# Patient Record
Sex: Male | Born: 1950 | Race: White | Hispanic: No | Marital: Married | Smoking: Former smoker
Health system: Southern US, Community
[De-identification: ages and names within clinical notes are randomized; demographics above are authoritative.]

## PROBLEM LIST (undated history)

## (undated) DIAGNOSIS — N183 Chronic kidney disease, stage 3 unspecified: Secondary | ICD-10-CM

## (undated) DIAGNOSIS — M109 Gout, unspecified: Secondary | ICD-10-CM

## (undated) DIAGNOSIS — E785 Hyperlipidemia, unspecified: Secondary | ICD-10-CM

---

## 2003-01-17 ENCOUNTER — Encounter: Payer: Self-pay | Admitting: Internal Medicine

## 2003-01-17 ENCOUNTER — Encounter: Admission: RE | Admit: 2003-01-17 | Discharge: 2003-01-17 | Payer: Self-pay | Admitting: Internal Medicine

## 2004-09-24 ENCOUNTER — Ambulatory Visit: Payer: Self-pay | Admitting: Internal Medicine

## 2004-10-01 ENCOUNTER — Ambulatory Visit: Payer: Self-pay | Admitting: Internal Medicine

## 2004-10-16 ENCOUNTER — Ambulatory Visit: Payer: Self-pay | Admitting: Internal Medicine

## 2004-12-11 ENCOUNTER — Ambulatory Visit: Payer: Self-pay | Admitting: Internal Medicine

## 2005-03-16 ENCOUNTER — Ambulatory Visit: Payer: Self-pay | Admitting: Internal Medicine

## 2005-10-17 ENCOUNTER — Emergency Department (HOSPITAL_COMMUNITY): Admission: EM | Admit: 2005-10-17 | Discharge: 2005-10-17 | Payer: Self-pay | Admitting: Emergency Medicine

## 2006-09-23 ENCOUNTER — Ambulatory Visit: Payer: Self-pay | Admitting: Internal Medicine

## 2007-03-14 ENCOUNTER — Ambulatory Visit: Payer: Self-pay | Admitting: Internal Medicine

## 2007-03-20 ENCOUNTER — Ambulatory Visit: Payer: Self-pay | Admitting: Internal Medicine

## 2007-03-29 DIAGNOSIS — E785 Hyperlipidemia, unspecified: Secondary | ICD-10-CM | POA: Insufficient documentation

## 2007-04-14 ENCOUNTER — Telehealth: Payer: Self-pay | Admitting: Internal Medicine

## 2007-10-27 ENCOUNTER — Ambulatory Visit: Payer: Self-pay | Admitting: Family Medicine

## 2007-10-27 DIAGNOSIS — F411 Generalized anxiety disorder: Secondary | ICD-10-CM | POA: Insufficient documentation

## 2007-11-03 ENCOUNTER — Encounter: Payer: Self-pay | Admitting: Family Medicine

## 2007-11-10 ENCOUNTER — Telehealth: Payer: Self-pay | Admitting: Family Medicine

## 2020-09-20 DIAGNOSIS — M25531 Pain in right wrist: Secondary | ICD-10-CM | POA: Diagnosis not present

## 2020-10-09 DIAGNOSIS — M79672 Pain in left foot: Secondary | ICD-10-CM | POA: Diagnosis not present

## 2020-10-09 DIAGNOSIS — M79675 Pain in left toe(s): Secondary | ICD-10-CM | POA: Diagnosis not present

## 2020-12-26 DIAGNOSIS — N1831 Chronic kidney disease, stage 3a: Secondary | ICD-10-CM | POA: Diagnosis not present

## 2020-12-26 DIAGNOSIS — E782 Mixed hyperlipidemia: Secondary | ICD-10-CM | POA: Diagnosis not present

## 2020-12-26 DIAGNOSIS — Z1211 Encounter for screening for malignant neoplasm of colon: Secondary | ICD-10-CM | POA: Diagnosis not present

## 2020-12-26 DIAGNOSIS — R7309 Other abnormal glucose: Secondary | ICD-10-CM | POA: Diagnosis not present

## 2020-12-26 DIAGNOSIS — Z0000 Encounter for general adult medical examination without abnormal findings: Secondary | ICD-10-CM | POA: Diagnosis not present

## 2020-12-26 DIAGNOSIS — R5383 Other fatigue: Secondary | ICD-10-CM | POA: Diagnosis not present

## 2021-04-17 DIAGNOSIS — M7989 Other specified soft tissue disorders: Secondary | ICD-10-CM | POA: Diagnosis not present

## 2021-04-24 DIAGNOSIS — M109 Gout, unspecified: Secondary | ICD-10-CM | POA: Diagnosis not present

## 2021-04-24 DIAGNOSIS — M7989 Other specified soft tissue disorders: Secondary | ICD-10-CM | POA: Diagnosis not present

## 2021-04-24 DIAGNOSIS — N1831 Chronic kidney disease, stage 3a: Secondary | ICD-10-CM | POA: Diagnosis not present

## 2021-05-14 DIAGNOSIS — M109 Gout, unspecified: Secondary | ICD-10-CM | POA: Diagnosis not present

## 2021-05-27 DIAGNOSIS — M109 Gout, unspecified: Secondary | ICD-10-CM | POA: Diagnosis not present

## 2021-05-28 ENCOUNTER — Ambulatory Visit
Admission: RE | Admit: 2021-05-28 | Discharge: 2021-05-28 | Disposition: A | Discharge status: Home / Self Care | Payer: Self-pay | Source: Ambulatory Visit | Attending: Family Medicine | Admitting: Family Medicine

## 2021-05-28 ENCOUNTER — Other Ambulatory Visit: Payer: Self-pay | Admitting: Family Medicine

## 2021-05-28 DIAGNOSIS — R52 Pain, unspecified: Secondary | ICD-10-CM

## 2021-05-28 DIAGNOSIS — M19071 Primary osteoarthritis, right ankle and foot: Secondary | ICD-10-CM | POA: Diagnosis not present

## 2021-06-08 ENCOUNTER — Encounter (HOSPITAL_COMMUNITY): Payer: Self-pay | Admitting: Family Medicine

## 2021-06-08 ENCOUNTER — Other Ambulatory Visit: Payer: Self-pay

## 2021-06-08 ENCOUNTER — Emergency Department (HOSPITAL_COMMUNITY): Payer: Medicare Other

## 2021-06-08 ENCOUNTER — Inpatient Hospital Stay (HOSPITAL_COMMUNITY)
Admission: EM | Admit: 2021-06-08 | Discharge: 2021-06-15 | DRG: 308 | Disposition: A | Discharge status: Home / Self Care | Payer: Medicare Other | Source: Ambulatory Visit | Attending: Family Medicine | Admitting: Family Medicine

## 2021-06-08 DIAGNOSIS — G479 Sleep disorder, unspecified: Secondary | ICD-10-CM | POA: Diagnosis not present

## 2021-06-08 DIAGNOSIS — R531 Weakness: Secondary | ICD-10-CM | POA: Diagnosis not present

## 2021-06-08 DIAGNOSIS — I5021 Acute systolic (congestive) heart failure: Secondary | ICD-10-CM | POA: Diagnosis present

## 2021-06-08 DIAGNOSIS — I429 Cardiomyopathy, unspecified: Secondary | ICD-10-CM | POA: Diagnosis not present

## 2021-06-08 DIAGNOSIS — F419 Anxiety disorder, unspecified: Secondary | ICD-10-CM | POA: Diagnosis present

## 2021-06-08 DIAGNOSIS — I340 Nonrheumatic mitral (valve) insufficiency: Secondary | ICD-10-CM | POA: Diagnosis not present

## 2021-06-08 DIAGNOSIS — I513 Intracardiac thrombosis, not elsewhere classified: Secondary | ICD-10-CM | POA: Diagnosis not present

## 2021-06-08 DIAGNOSIS — M109 Gout, unspecified: Secondary | ICD-10-CM | POA: Diagnosis present

## 2021-06-08 DIAGNOSIS — R002 Palpitations: Secondary | ICD-10-CM | POA: Diagnosis not present

## 2021-06-08 DIAGNOSIS — I130 Hypertensive heart and chronic kidney disease with heart failure and stage 1 through stage 4 chronic kidney disease, or unspecified chronic kidney disease: Principal | ICD-10-CM | POA: Diagnosis present

## 2021-06-08 DIAGNOSIS — R000 Tachycardia, unspecified: Secondary | ICD-10-CM | POA: Diagnosis not present

## 2021-06-08 DIAGNOSIS — I517 Cardiomegaly: Secondary | ICD-10-CM | POA: Diagnosis not present

## 2021-06-08 DIAGNOSIS — N1832 Chronic kidney disease, stage 3b: Secondary | ICD-10-CM | POA: Diagnosis present

## 2021-06-08 DIAGNOSIS — Z743 Need for continuous supervision: Secondary | ICD-10-CM | POA: Diagnosis not present

## 2021-06-08 DIAGNOSIS — E785 Hyperlipidemia, unspecified: Secondary | ICD-10-CM | POA: Diagnosis not present

## 2021-06-08 DIAGNOSIS — E1122 Type 2 diabetes mellitus with diabetic chronic kidney disease: Secondary | ICD-10-CM | POA: Diagnosis not present

## 2021-06-08 DIAGNOSIS — N179 Acute kidney failure, unspecified: Secondary | ICD-10-CM | POA: Diagnosis not present

## 2021-06-08 DIAGNOSIS — I4891 Unspecified atrial fibrillation: Secondary | ICD-10-CM | POA: Diagnosis present

## 2021-06-08 DIAGNOSIS — Z20822 Contact with and (suspected) exposure to covid-19: Secondary | ICD-10-CM | POA: Diagnosis not present

## 2021-06-08 DIAGNOSIS — I4819 Other persistent atrial fibrillation: Secondary | ICD-10-CM | POA: Diagnosis not present

## 2021-06-08 DIAGNOSIS — Z8616 Personal history of COVID-19: Secondary | ICD-10-CM

## 2021-06-08 DIAGNOSIS — I509 Heart failure, unspecified: Secondary | ICD-10-CM

## 2021-06-08 DIAGNOSIS — R9431 Abnormal electrocardiogram [ECG] [EKG]: Secondary | ICD-10-CM

## 2021-06-08 DIAGNOSIS — R0602 Shortness of breath: Secondary | ICD-10-CM

## 2021-06-08 DIAGNOSIS — E876 Hypokalemia: Secondary | ICD-10-CM | POA: Diagnosis not present

## 2021-06-08 DIAGNOSIS — Z23 Encounter for immunization: Secondary | ICD-10-CM

## 2021-06-08 DIAGNOSIS — R0902 Hypoxemia: Secondary | ICD-10-CM | POA: Diagnosis not present

## 2021-06-08 DIAGNOSIS — I081 Rheumatic disorders of both mitral and tricuspid valves: Secondary | ICD-10-CM | POA: Diagnosis not present

## 2021-06-08 DIAGNOSIS — N183 Chronic kidney disease, stage 3 unspecified: Secondary | ICD-10-CM | POA: Diagnosis not present

## 2021-06-08 DIAGNOSIS — I499 Cardiac arrhythmia, unspecified: Secondary | ICD-10-CM | POA: Diagnosis not present

## 2021-06-08 DIAGNOSIS — R5383 Other fatigue: Secondary | ICD-10-CM | POA: Diagnosis not present

## 2021-06-08 HISTORY — DX: Chronic kidney disease, stage 3 unspecified: N18.30

## 2021-06-08 HISTORY — DX: Hyperlipidemia, unspecified: E78.5

## 2021-06-08 HISTORY — DX: Gout, unspecified: M10.9

## 2021-06-08 LAB — COMPREHENSIVE METABOLIC PANEL
ALT: 18 U/L (ref 0–44)
AST: 21 U/L (ref 15–41)
Albumin: 3.1 g/dL — ABNORMAL LOW (ref 3.5–5.0)
Alkaline Phosphatase: 110 U/L (ref 38–126)
Anion gap: 10 (ref 5–15)
BUN: 29 mg/dL — ABNORMAL HIGH (ref 8–23)
CO2: 21 mmol/L — ABNORMAL LOW (ref 22–32)
Calcium: 8.8 mg/dL — ABNORMAL LOW (ref 8.9–10.3)
Chloride: 109 mmol/L (ref 98–111)
Creatinine, Ser: 1.53 mg/dL — ABNORMAL HIGH (ref 0.61–1.24)
GFR, Estimated: 49 mL/min — ABNORMAL LOW (ref >60)
Glucose, Bld: 107 mg/dL — ABNORMAL HIGH (ref 70–99)
Potassium: 4.4 mmol/L (ref 3.5–5.1)
Sodium: 140 mmol/L (ref 135–145)
Total Bilirubin: 1.1 mg/dL (ref 0.3–1.2)
Total Protein: 7.2 g/dL (ref 6.5–8.1)

## 2021-06-08 LAB — TROPONIN I (HIGH SENSITIVITY)
Troponin I (High Sensitivity): 16 ng/L (ref <18)
Troponin I (High Sensitivity): 15 ng/L (ref <18)

## 2021-06-08 LAB — URINALYSIS, ROUTINE W REFLEX MICROSCOPIC
Bilirubin Urine: NEGATIVE
Glucose, UA: NEGATIVE mg/dL
Hgb urine dipstick: NEGATIVE
Ketones, ur: NEGATIVE mg/dL
Leukocytes,Ua: NEGATIVE
Nitrite: NEGATIVE
Protein, ur: NEGATIVE mg/dL
Specific Gravity, Urine: 1.014 (ref 1.005–1.030)
pH: 5 (ref 5.0–8.0)

## 2021-06-08 LAB — BRAIN NATRIURETIC PEPTIDE: B Natriuretic Peptide: 1105.3 pg/mL — ABNORMAL HIGH (ref 0.0–100.0)

## 2021-06-08 LAB — RESP PANEL BY RT-PCR (FLU A&B, COVID) ARPGX2
Influenza A by PCR: NEGATIVE
Influenza B by PCR: NEGATIVE
SARS Coronavirus 2 by RT PCR: NEGATIVE

## 2021-06-08 LAB — CBC WITH DIFFERENTIAL/PLATELET
Abs Immature Granulocytes: 0.04 10*3/uL (ref 0.00–0.07)
Basophils Absolute: 0.1 10*3/uL (ref 0.0–0.1)
Basophils Relative: 1 %
Eosinophils Absolute: 0.1 10*3/uL (ref 0.0–0.5)
Eosinophils Relative: 2 %
HCT: 39.9 % (ref 39.0–52.0)
Hemoglobin: 12.1 g/dL — ABNORMAL LOW (ref 13.0–17.0)
Immature Granulocytes: 1 %
Lymphocytes Relative: 19 %
Lymphs Abs: 1.4 10*3/uL (ref 0.7–4.0)
MCH: 26.7 pg (ref 26.0–34.0)
MCHC: 30.3 g/dL (ref 30.0–36.0)
MCV: 88.1 fL (ref 80.0–100.0)
Monocytes Absolute: 0.8 10*3/uL (ref 0.1–1.0)
Monocytes Relative: 11 %
Neutro Abs: 5 10*3/uL (ref 1.7–7.7)
Neutrophils Relative %: 66 %
Platelets: 205 10*3/uL (ref 150–400)
RBC: 4.53 MIL/uL (ref 4.22–5.81)
RDW: 15.8 % — ABNORMAL HIGH (ref 11.5–15.5)
WBC: 7.4 10*3/uL (ref 4.0–10.5)
nRBC: 0 % (ref 0.0–0.2)

## 2021-06-08 LAB — MAGNESIUM: Magnesium: 2.1 mg/dL (ref 1.7–2.4)

## 2021-06-08 LAB — TSH: TSH: 3.281 u[IU]/mL (ref 0.350–4.500)

## 2021-06-08 MED ORDER — FUROSEMIDE 10 MG/ML IJ SOLN
40.0000 mg | Freq: Two times a day (BID) | INTRAMUSCULAR | Status: AC
  Administered 2021-06-09 (×2): 40 mg via INTRAVENOUS
  Filled 2021-06-08 (×2): qty 4

## 2021-06-08 MED ORDER — FUROSEMIDE 10 MG/ML IJ SOLN
40.0000 mg | Freq: Once | INTRAMUSCULAR | Status: AC
  Administered 2021-06-09: 40 mg via INTRAVENOUS
  Filled 2021-06-08: qty 4

## 2021-06-08 MED ORDER — ACETAMINOPHEN 650 MG RE SUPP: 650.0000 mg | Freq: Four times a day (QID) | RECTAL | Status: DC | PRN

## 2021-06-08 MED ORDER — SODIUM CHLORIDE 0.9 % IV BOLUS
500.0000 mL | Freq: Once | INTRAVENOUS | Status: AC
  Administered 2021-06-08: 500 mL via INTRAVENOUS

## 2021-06-08 MED ORDER — APIXABAN 5 MG PO TABS
5.0000 mg | ORAL_TABLET | Freq: Two times a day (BID) | ORAL | Status: DC
  Administered 2021-06-09: 5 mg via ORAL
  Filled 2021-06-08: qty 1

## 2021-06-08 MED ORDER — ACETAMINOPHEN 325 MG PO TABS: 650.0000 mg | ORAL_TABLET | Freq: Four times a day (QID) | ORAL | Status: DC | PRN

## 2021-06-08 MED ORDER — SODIUM CHLORIDE 0.9 % IV SOLN: 250.0000 mL | INTRAVENOUS | Status: DC | PRN

## 2021-06-08 MED ORDER — SODIUM CHLORIDE 0.9% FLUSH
3.0000 mL | INTRAVENOUS | Status: DC | PRN
  Administered 2021-06-11: 3 mL via INTRAVENOUS

## 2021-06-08 MED ORDER — DILTIAZEM HCL-DEXTROSE 125-5 MG/125ML-% IV SOLN (PREMIX)
5.0000 mg/h | INTRAVENOUS | Status: DC
  Administered 2021-06-08: 5 mg/h via INTRAVENOUS
  Administered 2021-06-09 (×2): 15 mg/h via INTRAVENOUS
  Filled 2021-06-08 (×3): qty 125

## 2021-06-08 MED ORDER — SODIUM CHLORIDE 0.9% FLUSH
3.0000 mL | Freq: Two times a day (BID) | INTRAVENOUS | Status: DC
  Administered 2021-06-09 – 2021-06-15 (×12): 3 mL via INTRAVENOUS

## 2021-06-08 NOTE — H&P (Signed)
History and Physical    Brian Weeks:[id number] DOB: Sep 09, 1950 DOA: 06/08/2021  PCP: Kristen Loader, FNP   Patient coming from: Home  Chief Complaint: SOB with exertion, decreased energy level  HPI: Brian Weeks is a 70 y.o. male with medical history significant for Gout, HLD who presents with complaint of shortness of breath that is worsened by exertion.  Ports symptoms been present for the last few weeks.  He also reports decreased energy level and occasional dizziness when he first stands up.  He was diagnosed with gout a few weeks ago and has not been as active as he normally is with the gout.  He does notice bilateral lower leg swelling over the last week.  He is also had difficulty sleeping as he was "smothered" when he lays flat.  He has been sleeping at an incline in the last few nights but has not slept well he states.  Reports he feels like his heart was jumping this morning which caused him to go to the doctor and EKG showed he had an irregular rhythm and he was sent to the hospital for further evaluation.  He denies having any chest pain but he does report feeling intermittent palpitations.  Not noticed any factors which initially caused his symptoms.  Now his shortness of breath is worsened with exertion or activity. States he has never had these problems in the past.  States he has not been drinking alcohol recently and he does not use tobacco or illicit drugs.  ED Course: He has been hemodynamically stable in the emergency room with no fever.  He was found to have an elevated BNP of 1105 with initial troponin levels being normal at 16 and 15.  He has mild AKI with creatinine 1.53.  Electrolytes are normal.  CBC was unremarkable.  TSH was normal at 3.281.  COVID-19 and influenza A and B are negative.  ER physician discussed with cardiology and patient was not a candidate for cardioversion with an unknown time of onset of his atrial fibrillation.  EKG showed atrial  fibrillation with RVR.  He was started on Cardizem infusion.  Hospitalist service was asked to admit for further management  Review of Systems:  General: Denies weakness, fever, chills, weight loss, night sweats.  Denies dizziness.  Denies change in appetite HENT: Denies head trauma, headache, denies change in hearing, tinnitus. Denies nasal bleeding. Denies sore throat, sores in mouth.  Denies difficulty swallowing Eyes: Denies blurry vision, pain in eye, drainage.  Denies discoloration of eyes. Neck: Denies pain.  Denies swelling.  Denies pain with movement. Cardiovascular: Denies chest pain, palpitations.  Reports edema. Reports orthopnea Respiratory: Reports shortness of breath. Denies cough.  Denies wheezing.  Denies sputum production Gastrointestinal: Denies abdominal pain, swelling.  Denies nausea, vomiting, diarrhea.  Denies melena.  Denies hematemesis. Musculoskeletal: Denies limitation of movement.  Denies deformity or swelling.  Denies pain.  Denies arthralgias or myalgias. Genitourinary: Denies pelvic pain.  Denies urinary frequency or hesitancy.  Denies dysuria.  Skin: Denies rash.  Denies petechiae, purpura, ecchymosis. Neurological: Denies syncope.  Denies seizure activity.  Denies paresthesia.  Denies slurred speech, drooping face.  Denies visual change. Psychiatric: Denies depression, anxiety. Denies hallucinations.  History reviewed. No pertinent past medical history.  History reviewed. No pertinent surgical history.  Social History  reports that he has never smoked. He has never used smokeless tobacco. He reports that he does not currently use alcohol. He reports that he does not use drugs.  Not on File  History reviewed. No pertinent family history.   Prior to Admission medications   Not on File    Physical Exam: Vitals:   06/08/21 2100 06/08/21 2157 06/08/21 2200 06/08/21 2245  BP: (!) 136/96 (!) 123/94 (!) 123/96 107/87  Pulse: 69 (!) 53 (!) 121 (!) 123   Resp: (!) 29 (!) 30 (!) 21 (!) 24  Temp:      TempSrc:      SpO2: 97% 97% 96% 97%  Weight:      Height:        Constitutional: NAD, calm, comfortable Vitals:   06/08/21 2100 06/08/21 2157 06/08/21 2200 06/08/21 2245  BP: (!) 136/96 (!) 123/94 (!) 123/96 107/87  Pulse: 69 (!) 53 (!) 121 (!) 123  Resp: (!) 29 (!) 30 (!) 21 (!) 24  Temp:      TempSrc:      SpO2: 97% 97% 96% 97%  Weight:      Height:       General: WDWN, Alert and oriented x3.  Eyes: EOMI, PERRL, conjunctivae normal.  Sclera nonicteric HENT:  [place]/AT, external ears normal.  Nares patent without epistasis.  Mucous membranes are moist. Posterior pharynx clear.  Neck: Soft, normal range of motion, supple, no masses, no thyromegaly.  Trachea midline Respiratory: clear to auscultation bilaterally, no wheezing, no crackles. Normal respiratory effort. No accessory muscle use.  Cardiovascular: Irregularly irregular rhythm with tachycardia.  No murmurs / rubs / gallops. Mild lower extremity edema. 2+ pedal pulses. Abdomen: Soft, no tenderness, nondistended, no rebound or guarding.  No masses palpated. Bowel sounds normoactive Musculoskeletal: FROM. no cyanosis. No joint deformity upper and lower extremities. Normal muscle tone.  Skin: Warm, dry, intact no rashes, lesions, ulcers. No induration Neurologic: CN 2-12 grossly intact.  Normal speech.  Sensation intact to touch. Strength 5/5 in all extremities.   Psychiatric: Normal judgment and insight.  Normal mood.    Labs on Admission: I have personally reviewed following labs and imaging studies  CBC: Recent Labs  Lab 06/08/21 2045  WBC 7.4  NEUTROABS 5.0  HGB 12.1*  HCT 39.9  MCV 88.1  PLT 160    Basic Metabolic Panel: Recent Labs  Lab 06/08/21 2045  NA 140  K 4.4  CL 109  CO2 21*  GLUCOSE 107*  BUN 29*  CREATININE 1.53*  CALCIUM 8.8*  MG 2.1    GFR: Estimated Creatinine Clearance: 54.9 mL/min (A) (by C-G formula based on SCr of 1.53 mg/dL  (H)).  Liver Function Tests: Recent Labs  Lab 06/08/21 2045  AST 21  ALT 18  ALKPHOS 110  BILITOT 1.1  PROT 7.2  ALBUMIN 3.1*    Urine analysis:    Component Value Date/Time   COLORURINE YELLOW 06/08/2021 1937   APPEARANCEUR CLEAR 06/08/2021 1937   LABSPEC 1.014 06/08/2021 1937   PHURINE 5.0 06/08/2021 1937   GLUCOSEU NEGATIVE 06/08/2021 1937   HGBUR NEGATIVE 06/08/2021 1937   BILIRUBINUR NEGATIVE 06/08/2021 1937   KETONESUR NEGATIVE 06/08/2021 1937   PROTEINUR NEGATIVE 06/08/2021 1937   NITRITE NEGATIVE 06/08/2021 1937   LEUKOCYTESUR NEGATIVE 06/08/2021 1937    Radiological Exams on Admission: DG CHEST PORT 1 VIEW  Result Date: 06/08/2021 CLINICAL DATA:  Shortness of breath EXAM: PORTABLE CHEST 1 VIEW COMPARISON:  None FINDINGS: Cardiomegaly. Diffuse bilateral interstitial opacity could be due to interstitial inflammatory process or minimal edema. No pleural effusion. No pneumothorax IMPRESSION: Cardiomegaly with diffuse interstitial opacity which may be due to mild edema or  interstitial inflammatory process. Electronically Signed   By: Donavan Foil M.D.   On: 06/08/2021 22:21    EKG: Independently reviewed.  EKG shows atrial fibrillation with RVR.  Nonspecific ST changes but no acute ST elevation or depression.  QTc 536  Assessment/Plan Principal Problem:   Atrial fibrillation with RVR Mr. Bordas is admitted to cardiac telemetry floor.  Is on cardizem infusion which will be continued overnight verted to p.o. Cardizem when stabilized. Start Eliquis for anticoagulation Echocardiogram in the morning. Check serial troponin levels overnight As patient has been having symptoms for the last 3 weeks of shortness of breath and is unclear when exactly when the atrial fibrillation not a candidate for cardioversion at this time  Active Problems:   Acute CHF (congestive heart failure) BNP is elevated.  Mild pulmonary edema on chest x-ray.  Diurese with Lasix.  Monitor I&O's.   Monitor daily weight. Check serial troponin level Check Echocardiogram in am to evaluate wall motion, EF and valvular structure.    AKI (acute kidney injury)  Monitor renal function and electrolytes with labs in the morning.  Patient received a small bolus of normal saline in the emergency room.  No IV fluids overnight with diuresis with CHF    Prolonged QT interval Avoid medications which could further prolong QT interval.  Monitor on telemetry    DVT prophylaxis: Mr. Spraggins is started on Eliquis for full anticoagulation  Code Status:   Full Code  Family Communication:  Diagnosis and plan discussed with patient.  Patient verbalized understanding and agrees with plan.  Questions were answered.  Further recommendations to follow as clinically indicated Disposition Plan:   Patient is from:  Home  Anticipated DC to:  Home  Anticipated DC date:  Anticipate 2 midnight stay in hospital  Admission status:  Inpatient   Yevonne Aline Zoriah Pulice MD [facility]  How to contact the [facility] Attending or Consulting provider [place] or covering provider during after hours [place], for this patient?   Check the care team in [facility] and look for a) attending/consulting [facility] provider listed and b) the [facility] team listed Log into [email or web address] and use [place]'s universal password to access. If you do not have the password, please contact the hospital operator. Locate the [facility] provider you are looking for under [facility] and page to a number that you can be directly reached. If you still have difficulty reaching the provider, please page the [facility] (Director on Call) for the Hospitalists listed on amion for assistance.  06/08/2021, 11:27 PM

## 2021-06-08 NOTE — ED Provider Notes (Signed)
[facility] EMERGENCY DEPARTMENT Provider Note   CSN: [id number] Arrival date & time: 06/08/21  1823     History Chief Complaint  Patient presents with   Atrial Fibrillation    Brian Weeks is a 70 y.o. male.  The history is provided by the patient and medical records. No language interpreter was used.  Atrial Fibrillation This is a new problem. Episode onset: unknown. The problem occurs constantly. The problem has not changed since onset.Associated symptoms include shortness of breath. Pertinent negatives include no chest pain, no abdominal pain and no headaches. The symptoms are aggravated by exertion. Nothing relieves the symptoms. He has tried nothing for the symptoms. The treatment provided no relief.      No past medical history on file.  Patient Active Problem List   Diagnosis Date Noted   ANXIETY 10/27/2007   HYPERLIPIDEMIA 03/29/2007    History reviewed. No pertinent surgical history.     No family history on file.     Home Medications Prior to Admission medications   Not on File    Allergies    Patient has no allergy information on record.  Review of Systems   Review of Systems  Constitutional:  Positive for fatigue. Negative for chills, diaphoresis and fever.  HENT:  Negative for congestion.   Eyes:  Negative for visual disturbance.  Respiratory:  Positive for chest tightness and shortness of breath. Negative for cough, wheezing and stridor.   Cardiovascular:  Positive for palpitations (occasional) and leg swelling (waxing and waning). Negative for chest pain.  Gastrointestinal:  Negative for abdominal pain, constipation, diarrhea, nausea and vomiting.  Genitourinary:  Negative for dysuria, flank pain, frequency and hematuria.  Musculoskeletal:  Negative for back pain, neck pain and neck stiffness.  Skin:  Negative for rash and wound.  Neurological:  Positive for light-headedness. Negative for syncope, numbness and  headaches.  Psychiatric/Behavioral:  Negative for agitation and confusion.   All other systems reviewed and are negative.  Physical Exam Updated Vital Signs BP 103/76   Pulse (!) 53   Temp 98 F (36.7 C) (Oral)   Resp 18   SpO2 93%   Physical Exam Vitals and nursing note reviewed.  Constitutional:      General: He is not in acute distress.    Appearance: He is well-developed. He is not ill-appearing, toxic-appearing or diaphoretic.  HENT:     Head: Normocephalic and atraumatic.     Nose: No congestion or rhinorrhea.     Mouth/Throat:     Mouth: Mucous membranes are moist.  Eyes:     Conjunctiva/sclera: Conjunctivae normal.     Pupils: Pupils are equal, round, and reactive to light.  Cardiovascular:     Rate and Rhythm: Tachycardia present. Rhythm irregular.     Pulses: Normal pulses.     Heart sounds: No murmur heard. Pulmonary:     Effort: Pulmonary effort is normal. No respiratory distress.     Breath sounds: Normal breath sounds. No wheezing, rhonchi or rales.  Chest:     Chest wall: No tenderness.  Abdominal:     General: Abdomen is flat. There is no distension.     Palpations: Abdomen is soft.     Tenderness: There is no abdominal tenderness. There is no right CVA tenderness, left CVA tenderness, guarding or rebound.  Musculoskeletal:     Cervical back: Neck supple.     Right lower leg: Edema present.     Left lower leg: Edema present.  Skin:    General: Skin is warm and dry.     Capillary Refill: Capillary refill takes less than 2 seconds.  Neurological:     General: No focal deficit present.     Mental Status: He is alert.     Sensory: No sensory deficit.     Motor: No weakness.  Psychiatric:        Mood and Affect: Mood normal.    ED Results / Procedures / Treatments   Labs (all labs ordered are listed, but only abnormal results are displayed) Labs Reviewed  CBC WITH DIFFERENTIAL/PLATELET - Abnormal; Notable for the following components:      Result  Value   Hemoglobin 12.1 (*)    RDW 15.8 (*)    All other components within normal limits  COMPREHENSIVE METABOLIC PANEL - Abnormal; Notable for the following components:   CO2 21 (*)    Glucose, Bld 107 (*)    BUN 29 (*)    Creatinine, Ser 1.53 (*)    Calcium 8.8 (*)    Albumin 3.1 (*)    GFR, Estimated 49 (*)    All other components within normal limits  BRAIN NATRIURETIC PEPTIDE - Abnormal; Notable for the following components:   B Natriuretic Peptide 1,105.3 (*)    All other components within normal limits  RESP PANEL BY RT-PCR (FLU A&B, COVID) ARPGX2  URINE CULTURE  TSH  MAGNESIUM  URINALYSIS, ROUTINE W REFLEX MICROSCOPIC  TROPONIN I (HIGH SENSITIVITY)  TROPONIN I (HIGH SENSITIVITY)    EKG EKG Interpretation  Date/Time:  Monday June 08 2021 20:24:20 EDT Ventricular Rate:  133 PR Interval:    QRS Duration: 87 QT Interval:  360 QTC Calculation: 536 R Axis:   39 Text Interpretation: Atrial fibrillation Borderline T wave abnormalities Prolonged QT interval No prior ECG for comparison. No STEMI Confirmed by Antony Blackbird [phone]) on 06/08/2021 8:39:41 PM  Radiology DG CHEST PORT 1 VIEW  Result Date: 06/08/2021 CLINICAL DATA:  Shortness of breath EXAM: PORTABLE CHEST 1 VIEW COMPARISON:  None FINDINGS: Cardiomegaly. Diffuse bilateral interstitial opacity could be due to interstitial inflammatory process or minimal edema. No pleural effusion. No pneumothorax IMPRESSION: Cardiomegaly with diffuse interstitial opacity which may be due to mild edema or interstitial inflammatory process. Electronically Signed   By: Donavan Foil M.D.   On: 06/08/2021 22:21    Procedures Procedures   CRITICAL CARE Performed by: Gwenyth Allegra Sally-Anne Wamble Total critical care time: 35 minutes Critical care time was exclusive of separately billable procedures and treating other patients. Critical care was necessary to treat or prevent imminent or life-threatening deterioration. Critical care was  time spent personally by me on the following activities: development of treatment plan with patient and/or surrogate as well as nursing, discussions with consultants, evaluation of patient's response to treatment, examination of patient, obtaining history from patient or surrogate, ordering and performing treatments and interventions, ordering and review of laboratory studies, ordering and review of radiographic studies, pulse oximetry and re-evaluation of patient's condition.   Medications Ordered in ED Medications  diltiazem (CARDIZEM) 125 mg in dextrose 5% 125 mL (1 mg/mL) infusion (15 mg/hr Intravenous Rate/Dose Change 06/08/21 2156)  furosemide (LASIX) injection 40 mg (has no administration in time range)  sodium chloride 0.9 % bolus 500 mL (0 mLs Intravenous Stopped 06/08/21 2129)     ED Course  I have reviewed the triage vital signs and the nursing notes.  Pertinent labs & imaging results that were available during my care of the  patient were reviewed by me and considered in my medical decision making (see chart for details).    MDM Rules/Calculators/A&P                           Brian Weeks is a 70 y.o. male with a past medical history significant for anxiety and hyperlipidemia who presents with lightheadedness, near syncope, exertional shortness of, and fatigue.  According to patient and family, for the last few weeks patient has been having more and more fatigue and both difficulty sleeping at night and sleeping in the day.  He denies any chest pains but reports has had more more shortness of breath including exertional symptoms.  He reports he is occasionally felt palpitations but no hard onset of constant palpitations.  He reports having near syncopal episodes but denies syncope.  He reports no nausea, vomiting, constipation, diarrhea, or urinary changes.  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  He denies any other changes and reports he had COVID back in May of this year.  He is had a cough on and off ever since then but reports no new cough today.  He  denies any new fevers or chills.  Per EMS report to nursing, patient's heart rate was in the 160s with A. fib with RVR and patient was given 10 mg of Cardizem at reduced into the 130s to 140s on arrival here.  On my exam, lungs were clear and I did not hear specific rhonchi or wheezing or significant rales.  Chest and abdomen were nontender.  He does have some mild edema in his legs which he reports waxes and wanes but is improved at this time.  He had no back tenderness or abdominal tenderness.  Good pulses in extremities.  Patient's heart rate was in the 130s to 140s with A. fib and RVR on my evaluation.  EKG showed A. fib with RVR with no STEMI.   Blood pressure initially was in the low 100s and the 44H systolic.  We will give a's very small amount of fluids as his lungs did not sound completely fluid-filled on my exam to help with his blood pressure but I do feel need to slow down his heart rate.  He does not have a clear start to his symptoms and he is not on any blood thinners.  Do not feel he is safe for ED cardioversion at this time.  Will start on Cardizem drip and get other work-up.  Anticipate admission for further management of his new onset A. fib, current RVR, and exertional symptoms.    10:35 PM Work-up has begun to return.  BNP is elevated.  Creatinine is 1.53 but there is no good number for comparison.  Initial troponin is negative.  CBC shows mild anemia but no leukocytosis.  COVID and flu are negative.  TSH not elevated.  Magnesium normal.  Chest x-ray shows evidence of pulmonary edema consistent with likely fluid overload related to his new A. fib and A. fib with RVR.  Cardiology was just called who agrees with admission to medicine service for further management.   They will follow.  Will speak with medicine about diuresis as patient does not take diuretics and we gave a small amount of fluids initially to help with soft pressures.  Medicine team called  for admission.  Chart review shows that patient is seen by [facility] family medicine.  Will call hospitalist service for admission.   Final Clinical Impression(s) / ED Diagnoses Final diagnoses:  Shortness of breath  Atrial fibrillation with RVR (HCC)     Clinical Impression: 1. Atrial fibrillation with RVR ([place])   2. Shortness of breath     Disposition: Admit  This note was prepared with assistance of Dragon voice recognition software. Occasional wrong-word or sound-a-like substitutions may have occurred due to the inherent limitations of voice recognition software.     Katalina Magri, Gwenyth Allegra, MD 06/08/21 [phone]

## 2021-06-08 NOTE — ED Notes (Signed)
RN aware of pts HR

## 2021-06-08 NOTE — ED Triage Notes (Signed)
Pt BIB [facility] from urgent care d/t [place] A-Fib, rate in the 160's. Pt reports initially going to UC d/t difficulty sleeping over the last 2-3 weeks, as well as generalized weakness & SOB. Denies CP, EMS reports 10 mg Cardizem given with no effect. BP 140/80, 98% on RA, O2 via n/c on 3L helped alleviate some of the SOB he was feeling (per pt). A/Ox4, verbal- able to make need known upon arrival to ED.

## 2021-06-09 ENCOUNTER — Inpatient Hospital Stay (HOSPITAL_COMMUNITY): Payer: Medicare Other

## 2021-06-09 ENCOUNTER — Encounter (HOSPITAL_COMMUNITY): Payer: Self-pay | Admitting: Family Medicine

## 2021-06-09 DIAGNOSIS — N179 Acute kidney failure, unspecified: Secondary | ICD-10-CM | POA: Diagnosis not present

## 2021-06-09 DIAGNOSIS — I4891 Unspecified atrial fibrillation: Secondary | ICD-10-CM

## 2021-06-09 DIAGNOSIS — I5021 Acute systolic (congestive) heart failure: Secondary | ICD-10-CM | POA: Diagnosis not present

## 2021-06-09 LAB — ECHOCARDIOGRAM COMPLETE
AR max vel: 2.03 cm2
AV Area VTI: 1.48 cm2
AV Area mean vel: 1.79 cm2
AV Mean grad: 2 mmHg
AV Peak grad: 3.7 mmHg
Ao pk vel: 0.97 m/s
Height: 71 in
S' Lateral: 5.2 cm
Weight: 3632 oz

## 2021-06-09 LAB — LIPID PANEL
Cholesterol: 111 mg/dL (ref 0–200)
HDL: 32 mg/dL — ABNORMAL LOW (ref >40)
LDL Cholesterol: 71 mg/dL (ref 0–99)
Total CHOL/HDL Ratio: 3.5 RATIO
Triglycerides: 40 mg/dL (ref <150)
VLDL: 8 mg/dL (ref 0–40)

## 2021-06-09 LAB — CBC
HCT: 42.7 % (ref 39.0–52.0)
Hemoglobin: 13 g/dL (ref 13.0–17.0)
MCH: 26.7 pg (ref 26.0–34.0)
MCHC: 30.4 g/dL (ref 30.0–36.0)
MCV: 87.7 fL (ref 80.0–100.0)
Platelets: 206 10*3/uL (ref 150–400)
RBC: 4.87 MIL/uL (ref 4.22–5.81)
RDW: 15.7 % — ABNORMAL HIGH (ref 11.5–15.5)
WBC: 7.6 10*3/uL (ref 4.0–10.5)
nRBC: 0 % (ref 0.0–0.2)

## 2021-06-09 LAB — BASIC METABOLIC PANEL
Anion gap: 11 (ref 5–15)
BUN: 27 mg/dL — ABNORMAL HIGH (ref 8–23)
CO2: 24 mmol/L (ref 22–32)
Calcium: 9.2 mg/dL (ref 8.9–10.3)
Chloride: 105 mmol/L (ref 98–111)
Creatinine, Ser: 1.65 mg/dL — ABNORMAL HIGH (ref 0.61–1.24)
GFR, Estimated: 44 mL/min — ABNORMAL LOW (ref >60)
Glucose, Bld: 146 mg/dL — ABNORMAL HIGH (ref 70–99)
Potassium: 4 mmol/L (ref 3.5–5.1)
Sodium: 140 mmol/L (ref 135–145)

## 2021-06-09 LAB — TROPONIN I (HIGH SENSITIVITY)
Troponin I (High Sensitivity): 13 ng/L (ref <18)
Troponin I (High Sensitivity): 17 ng/L (ref <18)

## 2021-06-09 LAB — URINE CULTURE: Culture: NO GROWTH

## 2021-06-09 LAB — MRSA NEXT GEN BY PCR, NASAL: MRSA by PCR Next Gen: NOT DETECTED

## 2021-06-09 LAB — HIV ANTIBODY (ROUTINE TESTING W REFLEX): HIV Screen 4th Generation wRfx: NONREACTIVE

## 2021-06-09 MED ORDER — AMIODARONE HCL IN DEXTROSE 360-4.14 MG/200ML-% IV SOLN
30.0000 mg/h | INTRAVENOUS | Status: DC
  Administered 2021-06-09 – 2021-06-12 (×7): 30 mg/h via INTRAVENOUS
  Filled 2021-06-09 (×7): qty 200

## 2021-06-09 MED ORDER — AMIODARONE LOAD VIA INFUSION
150.0000 mg | Freq: Once | INTRAVENOUS | Status: AC
  Administered 2021-06-09: 150 mg via INTRAVENOUS
  Filled 2021-06-09: qty 83.34

## 2021-06-09 MED ORDER — INFLUENZA VAC A&B SA ADJ QUAD 0.5 ML IM PRSY
0.5000 mL | PREFILLED_SYRINGE | INTRAMUSCULAR | Status: AC
  Administered 2021-06-15: 0.5 mL via INTRAMUSCULAR
  Filled 2021-06-09 (×2): qty 0.5

## 2021-06-09 MED ORDER — AMIODARONE HCL IN DEXTROSE 360-4.14 MG/200ML-% IV SOLN
60.0000 mg/h | INTRAVENOUS | Status: DC
  Administered 2021-06-09: 60 mg/h via INTRAVENOUS
  Filled 2021-06-09: qty 200

## 2021-06-09 MED ORDER — APIXABAN 5 MG PO TABS
5.0000 mg | ORAL_TABLET | Freq: Two times a day (BID) | ORAL | Status: DC
  Administered 2021-06-09 – 2021-06-15 (×13): 5 mg via ORAL
  Filled 2021-06-09 (×13): qty 1

## 2021-06-09 MED ORDER — PERFLUTREN LIPID MICROSPHERE
1.0000 mL | INTRAVENOUS | Status: AC | PRN
  Administered 2021-06-09: 2 mL via INTRAVENOUS
  Filled 2021-06-09: qty 10

## 2021-06-09 NOTE — Progress Notes (Signed)
Pt arrived on unit with Amiodarone running at 30 mg/hr-  It had not been running for 6 hours at 60 mg/hr- looks at though it was started late in ED. Per Dr Clayton Bibles- increase dose back to 60 mg to ensure it has ran for 6 hours

## 2021-06-09 NOTE — Progress Notes (Incomplete)
°  Echocardiogram 2D Echocardiogram has been performed.  Brian Weeks F 06/09/2021, 12:19 PM

## 2021-06-09 NOTE — Progress Notes (Signed)
Pt O2 sats 87 he says he feels slightly SOB applied O2 2L-

## 2021-06-09 NOTE — Progress Notes (Signed)
Will make NPO after midnight for possible TEE/cardioversion tomorrow afternoon if logistics can be worked out with the endoscopy lab and anesthesia. I will discuss further with the patient tomorrow am.   Sherren Mocha 06/09/2021 6:26 PM

## 2021-06-09 NOTE — Consult Note (Addendum)
Cardiology Consultation:   Patient ID: Brian Weeks MRN: [id number]; DOB: [birth year]  Admit date: 06/08/2021 Date of Consult: 06/09/2021  PCP:  Kristen Loader, [place] Providers Cardiologist:  New to [facility]   Patient Profile:   Brian Weeks is a 70 y.o. male with a hx of Gout, CKD III and HLD who is being seen 06/09/2021 for the evaluation of afib RVR at the request of Dr. Ouida Sills.  History of Present Illness:   [name] was in his usual state of health when diagnosed with bilateral grout about a month ago secondary to lower leg swelling and pain.  Since then he has sedentary lifestyle.  He drives truck as part-time.  Since then he has noted exertional shortness of breath and felt due to deconditioning.  No associated exertional chest tightness.  He is also dealing with insomnia.  Lack of energy.  Yesterday morning around 9 AM he noted palpitation and worsening dyspnea.  He went for gout check of at PCP office yesterday and discuss his symptoms.  EKG noted A. fib RVR and sent to ER.  Patient reports his doctor has diagnosed with chronic kidney disease stage III about 2 months ago and advised to drink plenty of water.  Serum creatinine 1.53>>1.65 BNP 1105 Troponin negative x4 Hemoglobin 13.0 06/09/2021: Cholesterol 111; HDL 32; LDL Cholesterol 71; Triglycerides 40; VLDL 8 TSH normal Respiratory panel negative for influenza and COVID Chest x-ray with cardiomegaly with diffuse interstitial opacity which may be due to mild edema or interstitial inflammatory process.  The patient was admitted and started on IV diltiazem with improved heart rate to 100s from 130s.  He goes to dose of Eliquis for anticoagulation.  Got IV lasix 40mg  x2.  Patient reports family history of heart disease to father but does not know details.  Echo today  1. Left ventricular ejection fraction, by estimation, is 20 to 25%. The  left ventricle has severely decreased function. The  left ventricle  demonstrates global hypokinesis. The left ventricular internal cavity size  was mildly dilated. Left ventricular  diastolic function could not be evaluated.   2. Right ventricular systolic function is moderately reduced. The right  ventricular size is normal. There is moderately elevated pulmonary artery  systolic pressure. The estimated right ventricular systolic pressure is  23.9 mmHg.   3. Left atrial size was mildly dilated.   4. Right atrial size was mildly dilated.   5. The mitral valve is grossly normal. Mild to moderate mitral valve  regurgitation. No evidence of mitral stenosis.   6. Tricuspid valve regurgitation is moderate.   7. The aortic valve is tricuspid. Aortic valve regurgitation is not  visualized. No aortic stenosis is present.   8. The inferior vena cava is dilated in size with <50% respiratory  variability, suggesting right atrial pressure of 15 mmHg.  Past Medical History:  Diagnosis Date   CKD (chronic kidney disease) stage 3, GFR 30-59 ml/min (HCC)    Gout    HLD (hyperlipidemia)     Inpatient Medications: Scheduled Meds:  apixaban  5 mg Oral BID   furosemide  40 mg Intravenous BID   sodium chloride flush  3 mL Intravenous Q12H   Continuous Infusions:  sodium chloride     diltiazem (CARDIZEM) infusion 15 mg/hr (06/09/21 1257)   PRN Meds: sodium chloride, acetaminophen **OR** acetaminophen, perflutren lipid microspheres (DEFINITY) IV suspension, sodium chloride flush  Allergies:    Allergies  Allergen Reactions   Colchicine  Sweating    Social History:   Social History   Socioeconomic History   Marital status: Married    Spouse name: Not on file   Number of children: Not on file   Years of education: Not on file   Highest education level: Not on file  Occupational History   Not on file  Tobacco Use   Smoking status: Never   Smokeless tobacco: Never  Substance and Sexual Activity   Alcohol use: Not Currently   Drug  use: Never   Sexual activity: Not on file  Other Topics Concern   Not on file  Social History Narrative   Not on file   Social Determinants of Health   Financial Resource Strain: Not on file  Food Insecurity: Not on file  Transportation Needs: Not on file  Physical Activity: Not on file  Stress: Not on file  Social Connections: Not on file  Intimate Partner Violence: Not on file    Family History:   Family History  Problem Relation Age of Onset   Heart disease Father      ROS:  Please see the history of present illness.  All other ROS reviewed and negative.     Physical Exam/Data:   Vitals:   06/09/21 1000 06/09/21 1100 06/09/21 1200 06/09/21 1300  BP: 117/76 108/77 (!) 110/95 117/75  Pulse: 96  (!) 119 (!) 116  Resp: (!) 21 18 (!) 31 (!) 26  Temp:      TempSrc:      SpO2: 95% 100% 97% 94%  Weight:      Height:        Intake/Output Summary (Last 24 hours) at 06/09/2021 1358 Last data filed at 06/09/2021 1238 Gross per 24 hour  Intake 500 ml  Output 3150 ml  Net -2650 ml   Last 3 Weights 06/08/2021 10/27/2007  Weight (lbs) 227 lb 217 lb  Weight (kg) 102.967 kg 98.431 kg     Body mass index is 31.66 kg/m.  General:  Well nourished, well developed, in no acute distress HEENT: normal Neck: + JVD Vascular: No carotid bruits; Distal pulses 2+ bilaterally Cardiac:  normal S1, S2; irregularly irregular, +  murmur  Lungs:  clear to auscultation bilaterally, no wheezing, rhonchi or rales  Abd: soft, nontender, no hepatomegaly  Ext: Trace edema Musculoskeletal:  No deformities, BUE and BLE strength normal and equal Skin: warm and dry  Neuro:  CNs 2-12 intact, no focal abnormalities noted Psych:  Normal affect   EKG:  The EKG was personally reviewed and demonstrates: Atrial fibrillation at rate of 113 bpm, nonspecific T wave abnormality Telemetry:  Telemetry was personally reviewed and demonstrates: Atrial fibrillation 100s  Relevant CV Studies: As above    Laboratory Data:  High Sensitivity Troponin:   Recent Labs  Lab 06/08/21 2045 06/08/21 2137 06/09/21 0034 06/09/21 0152  TROPONINIHS 16 15 13 17      Chemistry Recent Labs  Lab 06/08/21 2045 06/09/21 0152  NA 140 140  K 4.4 4.0  CL 109 105  CO2 21* 24  GLUCOSE 107* 146*  BUN 29* 27*  CREATININE 1.53* 1.65*  CALCIUM 8.8* 9.2  MG 2.1  --   GFRNONAA 49* 44*  ANIONGAP 10 11    Recent Labs  Lab 06/08/21 2045  PROT 7.2  ALBUMIN 3.1*  AST 21  ALT 18  ALKPHOS 110  BILITOT 1.1   Lipids  Recent Labs  Lab 06/09/21 0152  CHOL 111  TRIG 40  HDL 32*  LDLCALC 71  CHOLHDL 3.5    Hematology Recent Labs  Lab 06/08/21 2045 06/09/21 0152  WBC 7.4 7.6  RBC 4.53 4.87  HGB 12.1* 13.0  HCT 39.9 42.7  MCV 88.1 87.7  MCH 26.7 26.7  MCHC 30.3 30.4  RDW 15.8* 15.7*  PLT 205 206   Thyroid  Recent Labs  Lab 06/08/21 2045  TSH 3.281    BNP Recent Labs  Lab 06/08/21 2045  BNP 1,105.3*      Radiology/Studies:  DG CHEST PORT 1 VIEW  Result Date: 06/08/2021 CLINICAL DATA:  Shortness of breath EXAM: PORTABLE CHEST 1 VIEW COMPARISON:  None FINDINGS: Cardiomegaly. Diffuse bilateral interstitial opacity could be due to interstitial inflammatory process or minimal edema. No pleural effusion. No pneumothorax IMPRESSION: Cardiomegaly with diffuse interstitial opacity which may be due to mild edema or interstitial inflammatory process. Electronically Signed   By: Donavan Foil M.D.   On: 06/08/2021 22:21   ECHOCARDIOGRAM COMPLETE  Result Date: 06/09/2021    ECHOCARDIOGRAM REPORT   Patient Name:   DYLLEN MENNING [facility] Date of Exam: 06/09/2021 Medical Rec #:  [id number]          Height:       71.0 in Accession #:    [id number]         Weight:       227.0 lb Date of Birth:  1951/08/01          BSA:          2.225 m Patient Age:    70 years           BP:           118/100 mmHg Patient Gender: M                  HR:           108 bpm. Exam Location:  Inpatient Procedure: 2D  Echo, Cardiac Doppler, Color Doppler and Intracardiac            Opacification Agent Indications:    Atrial Fibrillation  History:        Patient has no prior history of Echocardiogram examinations.                 Arrythmias:Atrial Fibrillation and Tachycardia.  Sonographer:    Merrie Roof RDCS Referring Phys: [id number] [place]  1. Left ventricular ejection fraction, by estimation, is 20 to 25%. The left ventricle has severely decreased function. The left ventricle demonstrates global hypokinesis. The left ventricular internal cavity size was mildly dilated. Left ventricular diastolic function could not be evaluated.  2. Right ventricular systolic function is moderately reduced. The right ventricular size is normal. There is moderately elevated pulmonary artery systolic pressure. The estimated right ventricular systolic pressure is 03.5 mmHg.  3. Left atrial size was mildly dilated.  4. Right atrial size was mildly dilated.  5. The mitral valve is grossly normal. Mild to moderate mitral valve regurgitation. No evidence of mitral stenosis.  6. Tricuspid valve regurgitation is moderate.  7. The aortic valve is tricuspid. Aortic valve regurgitation is not visualized. No aortic stenosis is present.  8. The inferior vena cava is dilated in size with <50% respiratory variability, suggesting right atrial pressure of 15 mmHg. FINDINGS  Left Ventricle: Left ventricular ejection fraction, by estimation, is 20 to 25%. The left ventricle has severely decreased function. The left ventricle demonstrates global hypokinesis. Definity contrast agent was given IV to delineate the left ventricular  endocardial borders. The left ventricular internal cavity size was mildly dilated. There is no left ventricular hypertrophy. Left ventricular diastolic function could not be evaluated due to atrial fibrillation. Left ventricular diastolic function could not be evaluated. Right Ventricle: The right ventricular size is  normal. No increase in right ventricular wall thickness. Right ventricular systolic function is moderately reduced. There is moderately elevated pulmonary artery systolic pressure. The tricuspid regurgitant velocity is 2.86 m/s, and with an assumed right atrial pressure of 15 mmHg, the estimated right ventricular systolic pressure is 14.9 mmHg. Left Atrium: Left atrial size was mildly dilated. Right Atrium: Right atrial size was mildly dilated. Pericardium: There is no evidence of pericardial effusion. Mitral Valve: The mitral valve is grossly normal. Mild to moderate mitral valve regurgitation. No evidence of mitral valve stenosis. Tricuspid Valve: The tricuspid valve is grossly normal. Tricuspid valve regurgitation is moderate . No evidence of tricuspid stenosis. Aortic Valve: The aortic valve is tricuspid. Aortic valve regurgitation is not visualized. No aortic stenosis is present. Aortic valve mean gradient measures 2.0 mmHg. Aortic valve peak gradient measures 3.7 mmHg. Aortic valve area, by VTI measures 1.48 cm. Pulmonic Valve: The pulmonic valve was grossly normal. Pulmonic valve regurgitation is not visualized. No evidence of pulmonic stenosis. Aorta: The aortic root and ascending aorta are structurally normal, with no evidence of dilitation. Venous: The inferior vena cava is dilated in size with less than 50% respiratory variability, suggesting right atrial pressure of 15 mmHg. IAS/Shunts: The atrial septum is grossly normal.  LEFT VENTRICLE PLAX 2D LVIDd:         5.80 cm LVIDs:         5.20 cm LV PW:         0.97 cm LV IVS:        0.83 cm LVOT diam:     2.10 cm LV SV:         25 LV SV Index:   11 LVOT Area:     3.46 cm  RIGHT VENTRICLE             IVC RV Basal diam:  3.80 cm     IVC diam: 2.40 cm RV S prime:     12.70 cm/s TAPSE (M-mode): 1.6 cm LEFT ATRIUM              Index        RIGHT ATRIUM           Index LA diam:        4.70 cm  2.11 cm/m   RA Area:     29.10 cm LA Vol (A2C):   105.0 ml 47.19  ml/m  RA Volume:   114.00 ml 51.23 ml/m LA Vol (A4C):   64.4 ml  28.94 ml/m LA Biplane Vol: 81.8 ml  36.76 ml/m  AORTIC VALVE AV Area (Vmax):    2.03 cm AV Area (Vmean):   1.79 cm AV Area (VTI):     1.48 cm AV Vmax:           96.50 cm/s AV Vmean:          73.000 cm/s AV VTI:            0.172 m AV Peak Grad:      3.7 mmHg AV Mean Grad:      2.0 mmHg LVOT Vmax:         56.60 cm/s LVOT Vmean:        37.700 cm/s LVOT VTI:  0.074 m LVOT/AV VTI ratio: 0.43  AORTA Ao Root diam: 3.05 cm Ao Asc diam:  3.10 cm TRICUSPID VALVE TR Peak grad:   32.7 mmHg TR Vmax:        286.00 cm/s  SHUNTS Systemic VTI:  0.07 m Systemic Diam: 2.10 cm Eleonore Chiquito MD Electronically signed by Eleonore Chiquito MD Signature Date/Time: 06/09/2021/1:24:43 PM    Final      Assessment and Plan:   New onset atrial fibrillation with rapid ventricular rate -First felt palpitation yesterday morning at 9:30 AM however he has been fatigue and experiencing dyspnea on exertion for past 1 month intermittently.  TSH normal.  -Echocardiogram showed severely reduced LV function at 20 to 25%, mild to moderate MR and moderate TR.  RV function moderately reduced.  RVSP 47.81mm HG.  -Heart rate is improving on IV Cardizem, now on 100s.  Cardizem would not be a good choice of drug given low LV function - Started on Eliquis for anticoagulation  2.  Acute systolic congestive heart failure -BNP elevated at 1100 -Chest x-ray showed cardiomegaly with mild edema or infiltrate -Echocardiogram showed severely reduced LV function at 20 to 25%, mild to moderate MR and moderate TR.  RV function moderately reduced.  RVSP 47.65mm HG.  -He is now started on IV Lasix 40 mg twice daily -Agree with diuresis>> strict I & O and daily weight  -We will start him on guideline directed medical therapy and review ischemic evaluation   3.  CKD stage III -Patient reports being diagnosed with chronic kidney disease stage III about 2 months ago.  Unknown baseline  renal function. - Serum creatinine 1.53>>1.65, follow closely with diuresis   4. Gout - Per primary team   MD to see.   Risk Assessment/Risk Scores:      New York Heart Association (NYHA) Functional Class NYHA Class II  CHA2DS2-VASc Score = 2  This indicates a 2.2% annual risk of stroke. The patient's score is based upon: CHF History: 1 HTN History: 0 Diabetes History: 0 Stroke History: 0 Vascular Disease History: 0 Age Score: 1 Gender Score: 0    For questions or updates, please contact Vivian Please consult [email or web address] for contact info under    Jarrett Soho, PA  06/09/2021 1:58 PM   Patient seen, examined. Available data reviewed. Agree with findings, assessment, and plan as outlined by Robbie Lis, PA.  The patient is independently interviewed and examined.  His wife is at the bedside.  He is alert, oriented, in no distress.  HEENT is normal.  Carotid upstrokes normal without bruits, JVP is moderately elevated, lungs are clear bilaterally, heart is irregularly irregular and tachycardic with no murmur or gallop, abdomen is soft, nontender, positive bowel sounds, no masses.  Extremities have trace pretibial edema.  Pedal pulses are 2+ and equal bilaterally.  Skin is warm and dry with no rash.  Telemetry shows atrial fibrillation with a heart rate ranging from 100 to 130 bpm.  Pertinent labs include normal troponins ranging from 13-17, elevated creatinine of 1.65, and elevated BNP of 1105.  The patient's 2D echo is reviewed and shows severe global LV systolic dysfunction with LVEF 20%, moderate RV dysfunction, and no significant valvular disease.  The patient has been treated with IV Lasix and he is diuresing well.  We discussed potential etiologies of his cardiomyopathy.  I think the most likely etiology is tachycardia-mediated as it is somewhat unclear when he went into atrial fibrillation.  Fortunately, his LV is  not severely dilated.  He only has mild  biatrial enlargement.  I think he should be changed from IV diltiazem to IV amiodarone.  He has already been started on apixaban.  While there is a small risk of converting to sinus rhythm after only a few doses of apixaban, I think this is the safest medication for him in the setting of heart failure and severe LV dysfunction.  Amiodarone should help control his rate and we will have an eye toward TEE/cardioversion.  I will follow-up with the patient in the morning.  He should be continued on his current diuretic therapy.  We will hold on ACE/ARB/Entresto in the setting of his kidney disease and ongoing IV diuretic therapy.  We will slowly add in heart failure medications as tolerated.  All of this is reviewed with the patient and his wife in detail today.  We will follow with you.  Sherren Mocha, M.D. 06/09/2021 4:23 PM

## 2021-06-09 NOTE — Progress Notes (Signed)
PROGRESS NOTE  Dannell Gortney Herbst    DOB: [birth year], 70 y.o.  EYC:[id number]  PCP: Kristen Loader, FNP   Code Status: Full Code   DOA: 06/08/2021   LOS: 1  Brief Narrative of Current Hospitalization  Brian Weeks is a 70 y.o. male with a PMH significant for gout, HLD. They presented from home to the ED on 06/08/2021 with exertional shortness of breath several weeks in the ED, it was found that they had A. fib RVR. They were treated with Cardizem drip and Eliquis.  Patient was admitted to medicine service for further workup and management of new onset A. fib as outlined in detail below.  06/09/21 -tachycardia has improved but still intermittently up to 140s during exam  Assessment & Plan  Principal Problem:   Atrial fibrillation with RVR (HCC) Active Problems:   Acute CHF (congestive heart failure) (HCC)   AKI (acute kidney injury) (HCC)   Prolonged QT interval  New onset A. fib with RVR-heart rate in the room intermittently up to 140.  Patient is asymptomatic.  He does not follow with a cardiologist.  Exhibiting high anxiety and frustration. -Continue Eliquis -Continue titrating Cardizem and will convert to p.o. once stable -Consulted cardiology for recommendation on p.o. Cardizem dosing and to establish care for outpatient follow-up -Echo pending  Hypervolemia with pulmonary vascular congestion, potentially congestive heart failure or consequence of RVR. -Continue diuresis -Follow-up echo  AKI-presumed normal kidney function at baseline.  Creatinine worsened since admission 1.53> 1.65 today.  Hopeful for improvement with diuresis -BMP a.m.  HLD-ASCVD score greater than 7.5% and not on previous lipid therapy.  LDL 71 on admission so just above goal -Recommend healthy lifestyle changes -Starting on atorvastatin 40  Gout-chronic, no acute outbreak or home medications  DVT prophylaxis:  apixaban (ELIQUIS) tablet 5 mg   Diet:  Diet Orders (From admission, onward)      Start     Ordered   06/08/21 2352  Diet Heart Room service appropriate? Yes; Fluid consistency: Thin  Diet effective now       Question Answer Comment  Room service appropriate? Yes   Fluid consistency: Thin      06/08/21 2351            Subjective 06/09/21    Pt reports asymptomatic.  Complains of not being able to sleep last night.  Disposition Plan & Communication  Status is: Inpatient  Remains inpatient appropriate because: Ongoing work-up    Inpatient Home  Family Communication: None Consults, Procedures, Significant Events  Consultants:  Cardiology  Procedures/significant events:  Echo  Objective   Vitals:   06/09/21 0500 06/09/21 0530 06/09/21 0600 06/09/21 0630  BP: 115/67 102/85 97/74 102/87  Pulse: (!) 106     Resp: 20 (!) 30 (!) 36 16  Temp:      TempSrc:      SpO2: 95% 94% 98% 99%  Weight:      Height:        Intake/Output Summary (Last 24 hours) at 06/09/2021 0714 Last data filed at 06/09/2021 0641 Gross per 24 hour  Intake 500 ml  Output 2450 ml  Net -1950 ml   Filed Weights   06/08/21 2028  Weight: 103 kg    Patient BMI: Body mass index is 31.66 kg/m.   Physical Exam: General: awake, alert, NAD Respiratory: CTAB, no wheezes, rales or rhonchi, normal respiratory effort. Cardiovascular: normal S1/S2, tachycardia and irregular rhythm, no JVD, murmurs, rubs, gallops, quick capillary refill  Gastrointestinal: soft, NT, ND, no HSM felt Nervous: A&O x3. no gross focal neurologic deficits, normal speech Extremities: moves all equally, trace LE edema, normal tone Skin: dry, intact, normal temperature, normal color, No rashes, lesions or ulcers Psychiatry: Agitated and anxious mood  Labs   I have personally reviewed following labs and imaging studies Admission on 06/08/2021  Component Date Value Ref Range Status   WBC 06/08/2021 7.4  4.0 - 10.5 K/uL Final   RBC 06/08/2021 4.53  4.22 - 5.81 MIL/uL Final   Hemoglobin 06/08/2021 12.1  (A)  13.0 - 17.0 g/dL Final   HCT 06/08/2021 39.9  39.0 - 52.0 % Final   MCV 06/08/2021 88.1  80.0 - 100.0 fL Final   MCH 06/08/2021 26.7  26.0 - 34.0 pg Final   MCHC 06/08/2021 30.3  30.0 - 36.0 g/dL Final   RDW 06/08/2021 15.8 (A)  11.5 - 15.5 % Final   Platelets 06/08/2021 205  150 - 400 K/uL Final   nRBC 06/08/2021 0.0  0.0 - 0.2 % Final   Neutrophils Relative % 06/08/2021 66  % Final   Neutro Abs 06/08/2021 5.0  1.7 - 7.7 K/uL Final   Lymphocytes Relative 06/08/2021 19  % Final   Lymphs Abs 06/08/2021 1.4  0.7 - 4.0 K/uL Final   Monocytes Relative 06/08/2021 11  % Final   Monocytes Absolute 06/08/2021 0.8  0.1 - 1.0 K/uL Final   Eosinophils Relative 06/08/2021 2  % Final   Eosinophils Absolute 06/08/2021 0.1  0.0 - 0.5 K/uL Final   Basophils Relative 06/08/2021 1  % Final   Basophils Absolute 06/08/2021 0.1  0.0 - 0.1 K/uL Final   Immature Granulocytes 06/08/2021 1  % Final   Abs Immature Granulocytes 06/08/2021 0.04  0.00 - 0.07 K/uL Final   Sodium 06/08/2021 140  135 - 145 mmol/L Final   Potassium 06/08/2021 4.4  3.5 - 5.1 mmol/L Final   Chloride 06/08/2021 109  98 - 111 mmol/L Final   CO2 06/08/2021 21 (A)  22 - 32 mmol/L Final   Glucose, Bld 06/08/2021 107 (A)  70 - 99 mg/dL Final   BUN 06/08/2021 29 (A)  8 - 23 mg/dL Final   Creatinine, Ser 06/08/2021 1.53 (A)  0.61 - 1.24 mg/dL Final   Calcium 06/08/2021 8.8 (A)  8.9 - 10.3 mg/dL Final   Total Protein 06/08/2021 7.2  6.5 - 8.1 g/dL Final   Albumin 06/08/2021 3.1 (A)  3.5 - 5.0 g/dL Final   AST 06/08/2021 21  15 - 41 U/L Final   ALT 06/08/2021 18  0 - 44 U/L Final   Alkaline Phosphatase 06/08/2021 110  38 - 126 U/L Final   Total Bilirubin 06/08/2021 1.1  0.3 - 1.2 mg/dL Final   GFR, Estimated 06/08/2021 49 (A)  >60 mL/min Final   Anion gap 06/08/2021 10  5 - 15 Final   TSH 06/08/2021 3.281  0.350 - 4.500 uIU/mL Final   Magnesium 06/08/2021 2.1  1.7 - 2.4 mg/dL Final   B Natriuretic Peptide 06/08/2021 1,105.3 (A)  0.0 -  100.0 pg/mL Final   Troponin I (High Sensitivity) 06/08/2021 16  <18 ng/L Final   Color, Urine 06/08/2021 YELLOW  YELLOW Final   APPearance 06/08/2021 CLEAR  CLEAR Final   Specific Gravity, Urine 06/08/2021 1.014  1.005 - 1.030 Final   pH 06/08/2021 5.0  5.0 - 8.0 Final   Glucose, UA 06/08/2021 NEGATIVE  NEGATIVE mg/dL Final   Hgb urine dipstick 06/08/2021 NEGATIVE  NEGATIVE Final   Bilirubin Urine 06/08/2021 NEGATIVE  NEGATIVE Final   Ketones, ur 06/08/2021 NEGATIVE  NEGATIVE mg/dL Final   Protein, ur 06/08/2021 NEGATIVE  NEGATIVE mg/dL Final   Nitrite 06/08/2021 NEGATIVE  NEGATIVE Final   Leukocytes,Ua 06/08/2021 NEGATIVE  NEGATIVE Final   SARS Coronavirus 2 by RT PCR 06/08/2021 NEGATIVE  NEGATIVE Final   Influenza A by PCR 06/08/2021 NEGATIVE  NEGATIVE Final   Influenza B by PCR 06/08/2021 NEGATIVE  NEGATIVE Final   Troponin I (High Sensitivity) 06/08/2021 15  <18 ng/L Final   HIV Screen 4th Generation wRfx 06/09/2021 Non Reactive  Non Reactive Final   Sodium 06/09/2021 140  135 - 145 mmol/L Final   Potassium 06/09/2021 4.0  3.5 - 5.1 mmol/L Final   Chloride 06/09/2021 105  98 - 111 mmol/L Final   CO2 06/09/2021 24  22 - 32 mmol/L Final   Glucose, Bld 06/09/2021 146 (A)  70 - 99 mg/dL Final   BUN 06/09/2021 27 (A)  8 - 23 mg/dL Final   Creatinine, Ser 06/09/2021 1.65 (A)  0.61 - 1.24 mg/dL Final   Calcium 06/09/2021 9.2  8.9 - 10.3 mg/dL Final   GFR, Estimated 06/09/2021 44 (A)  >60 mL/min Final   Anion gap 06/09/2021 11  5 - 15 Final   WBC 06/09/2021 7.6  4.0 - 10.5 K/uL Final   RBC 06/09/2021 4.87  4.22 - 5.81 MIL/uL Final   Hemoglobin 06/09/2021 13.0  13.0 - 17.0 g/dL Final   HCT 06/09/2021 42.7  39.0 - 52.0 % Final   MCV 06/09/2021 87.7  80.0 - 100.0 fL Final   MCH 06/09/2021 26.7  26.0 - 34.0 pg Final   MCHC 06/09/2021 30.4  30.0 - 36.0 g/dL Final   RDW 06/09/2021 15.7 (A)  11.5 - 15.5 % Final   Platelets 06/09/2021 206  150 - 400 K/uL Final   nRBC 06/09/2021 0.0  0.0 -  0.2 % Final   Troponin I (High Sensitivity) 06/09/2021 13  <18 ng/L Final   Troponin I (High Sensitivity) 06/09/2021 17  <18 ng/L Final   Cholesterol 06/09/2021 111  0 - 200 mg/dL Final   Triglycerides 06/09/2021 40  <150 mg/dL Final   HDL 06/09/2021 32 (A)  >40 mg/dL Final   Total CHOL/HDL Ratio 06/09/2021 3.5  RATIO Final   VLDL 06/09/2021 8  0 - 40 mg/dL Final   LDL Cholesterol 06/09/2021 71  0 - 99 mg/dL Final    Imaging Studies  DG CHEST PORT 1 VIEW  Result Date: 06/08/2021 CLINICAL DATA:  Shortness of breath EXAM: PORTABLE CHEST 1 VIEW COMPARISON:  None FINDINGS: Cardiomegaly. Diffuse bilateral interstitial opacity could be due to interstitial inflammatory process or minimal edema. No pleural effusion. No pneumothorax IMPRESSION: Cardiomegaly with diffuse interstitial opacity which may be due to mild edema or interstitial inflammatory process. Electronically Signed   By: Donavan Foil M.D.   On: 06/08/2021 22:21   Medications   Scheduled Meds:  apixaban  5 mg Oral BID   furosemide  40 mg Intravenous BID   sodium chloride flush  3 mL Intravenous Q12H   No recently discontinued medications to reconcile  LOS: 1 day   Time spent: >6min  Elliana Bal L Caio Devera, DO [facility] 06/09/2021, 7:14 AM   To contact the [facility] Attending or Consulting provider for this patient: Check the care team in [facility] for a) attending/consulting [place] provider listed and b) the [facility] team listed Log into [email or web address] and use [place]'s  universal password to access. If you do not have the password, please contact the hospital operator. Locate the [facility] provider you are looking for under [facility] and page to a number that you can be directly reached. If you still have difficulty reaching the provider, please page the [facility] (Director on Call) for the Hospitalists listed on amion for assistance.

## 2021-06-09 NOTE — H&P (View-Only) (Signed)
Will make NPO after midnight for possible TEE/cardioversion tomorrow afternoon if logistics can be worked out with the endoscopy lab and anesthesia. I will discuss further with the patient tomorrow am.   Sherren Mocha 06/09/2021 6:26 PM

## 2021-06-10 ENCOUNTER — Encounter (HOSPITAL_COMMUNITY)
Admission: EM | Disposition: A | Discharge status: Home / Self Care | Payer: Self-pay | Source: Ambulatory Visit | Attending: Family Medicine

## 2021-06-10 ENCOUNTER — Inpatient Hospital Stay (HOSPITAL_COMMUNITY): Payer: Medicare Other | Admitting: Anesthesiology

## 2021-06-10 ENCOUNTER — Other Ambulatory Visit (HOSPITAL_COMMUNITY): Payer: Self-pay

## 2021-06-10 ENCOUNTER — Inpatient Hospital Stay (HOSPITAL_COMMUNITY)
Admit: 2021-06-10 | Discharge: 2021-06-10 | Disposition: A | Discharge status: Home / Self Care | Payer: Medicare Other | Attending: Cardiology | Admitting: Cardiology

## 2021-06-10 ENCOUNTER — Encounter (HOSPITAL_COMMUNITY): Payer: Self-pay | Admitting: Family Medicine

## 2021-06-10 DIAGNOSIS — I4891 Unspecified atrial fibrillation: Secondary | ICD-10-CM | POA: Diagnosis not present

## 2021-06-10 DIAGNOSIS — I340 Nonrheumatic mitral (valve) insufficiency: Secondary | ICD-10-CM

## 2021-06-10 HISTORY — PX: TEE WITHOUT CARDIOVERSION: SHX5443

## 2021-06-10 LAB — BASIC METABOLIC PANEL
Anion gap: 9 (ref 5–15)
BUN: 27 mg/dL — ABNORMAL HIGH (ref 8–23)
CO2: 26 mmol/L (ref 22–32)
Calcium: 8.7 mg/dL — ABNORMAL LOW (ref 8.9–10.3)
Chloride: 104 mmol/L (ref 98–111)
Creatinine, Ser: 1.82 mg/dL — ABNORMAL HIGH (ref 0.61–1.24)
GFR, Estimated: 39 mL/min — ABNORMAL LOW (ref >60)
Glucose, Bld: 102 mg/dL — ABNORMAL HIGH (ref 70–99)
Potassium: 3.3 mmol/L — ABNORMAL LOW (ref 3.5–5.1)
Sodium: 139 mmol/L (ref 135–145)

## 2021-06-10 LAB — MAGNESIUM: Magnesium: 2 mg/dL (ref 1.7–2.4)

## 2021-06-10 SURGERY — ECHOCARDIOGRAM, TRANSESOPHAGEAL
Anesthesia: Monitor Anesthesia Care

## 2021-06-10 MED ORDER — PROPOFOL 10 MG/ML IV BOLUS
INTRAVENOUS | Status: DC | PRN
  Administered 2021-06-10 (×2): 20 mg via INTRAVENOUS

## 2021-06-10 MED ORDER — PROPOFOL 500 MG/50ML IV EMUL
INTRAVENOUS | Status: DC | PRN
  Administered 2021-06-10: 75 ug/kg/min via INTRAVENOUS

## 2021-06-10 MED ORDER — SODIUM CHLORIDE 0.9 % IV SOLN: INTRAVENOUS | Status: DC

## 2021-06-10 MED ORDER — PHENYLEPHRINE 40 MCG/ML (10ML) SYRINGE FOR IV PUSH (FOR BLOOD PRESSURE SUPPORT)
PREFILLED_SYRINGE | INTRAVENOUS | Status: DC | PRN
  Administered 2021-06-10 (×3): 120 ug via INTRAVENOUS
  Administered 2021-06-10: 80 ug via INTRAVENOUS

## 2021-06-10 MED ORDER — POTASSIUM CHLORIDE CRYS ER 20 MEQ PO TBCR
40.0000 meq | EXTENDED_RELEASE_TABLET | Freq: Once | ORAL | Status: AC
  Administered 2021-06-10: 40 meq via ORAL
  Filled 2021-06-10: qty 2

## 2021-06-10 NOTE — TOC Benefit Eligibility Note (Signed)
Patient Teacher, English as a foreign language completed.    The patient is currently admitted and upon discharge could be taking Eliquis 5 mg.  The current 30 day co-pay is, $126.23 due to a $79.23 deductible remaining .   The patient is currently admitted and upon discharge could be taking Xarelto 20mg .  The current 30 day co-pay is, $126.23 due to a $79.23 deductible remaining .   The patient is currently admitted and upon discharge could be taking Farxiga 10 mg.  The current 30 day co-pay is, $126.23 due to a $79.23 deductible remaining .   The patient is currently admitted and upon discharge could be taking Jardiance 10 mg.  The current 30 day co-pay is, $126.23 due to a $79.23 deductible remaining .   The patient is currently admitted and upon discharge could be taking Entresto 24-26 mg.  The current 30 day co-pay is, $126.23 due to a $79.23 deductible remaining .   The patient is insured through [place], [place] Patient Advocate Specialist [place] Patient Advocate Team Direct Number: [phone]  Fax: [phone]

## 2021-06-10 NOTE — Interval H&P Note (Signed)
History and Physical Interval Note:  06/10/2021 9:27 AM  Brian Weeks  has presented today for surgery, with the diagnosis of afib.  The various methods of treatment have been discussed with the patient and family. After consideration of risks, benefits and other options for treatment, the patient has consented to  Procedure(s): TRANSESOPHAGEAL ECHOCARDIOGRAM (TEE) (N/A) CARDIOVERSION (N/A) as a surgical intervention.  The patient's history has been reviewed, patient examined, no change in status, stable for surgery.  I have reviewed the patient's chart and labs.  Questions were answered to the patient's satisfaction.     [name] A Alleah Dearman

## 2021-06-10 NOTE — Anesthesia Preprocedure Evaluation (Addendum)
Anesthesia Evaluation  Patient identified by MRN, date of birth, ID band Patient awake    Reviewed: Allergy & Precautions, NPO status , Patient's Chart, lab work & pertinent test results  History of Anesthesia Complications Negative for: history of anesthetic complications  Airway Mallampati: II  TM Distance: >3 FB Neck ROM: Full    Dental no notable dental hx.    Pulmonary neg pulmonary ROS,    Pulmonary exam normal        Cardiovascular +CHF  Normal cardiovascular exam+ dysrhythmias Atrial Fibrillation   TTE 06/09/21: EF 20-25%, global hypokinesis, mild LVE, RV systolic function moderately reduced, moderately elevated PASP (47.7 mmHg), mild LAE/RAE, mild to moderate MR/TR   Neuro/Psych Anxiety negative neurological ROS     GI/Hepatic negative GI ROS, Neg liver ROS,   Endo/Other  negative endocrine ROS  Renal/GU Renal InsufficiencyRenal disease  negative genitourinary   Musculoskeletal negative musculoskeletal ROS (+)   Abdominal   Peds  Hematology negative hematology ROS (+)   Anesthesia Other Findings Day of surgery medications reviewed with patient.  Reproductive/Obstetrics negative OB ROS                            Anesthesia Physical Anesthesia Plan  ASA: 4  Anesthesia Plan: MAC   Post-op Pain Management:    Induction:   PONV Risk Score and Plan: Treatment may vary due to age or medical condition and Propofol infusion  Airway Management Planned: Natural Airway and Nasal Cannula  Additional Equipment:   Intra-op Plan:   Post-operative Plan:   Informed Consent: I have reviewed the patients History and Physical, chart, labs and discussed the procedure including the risks, benefits and alternatives for the proposed anesthesia with the patient or authorized representative who has indicated his/her understanding and acceptance.       Plan Discussed with:  CRNA  Anesthesia Plan Comments:        Anesthesia Quick Evaluation

## 2021-06-10 NOTE — Anesthesia Postprocedure Evaluation (Signed)
Anesthesia Post Note  Patient: Brian Weeks  Procedure(s) Performed: TRANSESOPHAGEAL ECHOCARDIOGRAM (TEE)     Patient location during evaluation: PACU Anesthesia Type: MAC Level of consciousness: awake and alert and oriented Pain management: pain level controlled Vital Signs Assessment: post-procedure vital signs reviewed and stable Respiratory status: spontaneous breathing, nonlabored ventilation and respiratory function stable Cardiovascular status: blood pressure returned to baseline Postop Assessment: no apparent nausea or vomiting Anesthetic complications: no   No notable events documented.  Last Vitals:  Vitals:   06/10/21 1035 06/10/21 1050  BP: 102/78 94/63  Pulse: (!) 128 (!) 127  Resp: 20   Temp: 36.5 C 36.4 C  SpO2: 97% 90%    Last Pain:  Vitals:   06/10/21 1050  TempSrc: Oral  PainSc: 0-No pain                 Marthenia Rolling

## 2021-06-10 NOTE — Interval H&P Note (Signed)
History and Physical Interval Note:  06/10/2021 9:27 AM  Brian Weeks  has presented today for surgery, with the diagnosis of afib.  The various methods of treatment have been discussed with the patient and family. After consideration of risks, benefits and other options for treatment, the patient has consented to  Procedure(s): TRANSESOPHAGEAL ECHOCARDIOGRAM (TEE) (N/A) CARDIOVERSION (N/A) as a surgical intervention.  The patient's history has been reviewed, patient examined, no change in status, stable for surgery.  I have reviewed the patient's chart and labs.  Questions were answered to the patient's satisfaction.     [name] A Christabel Camire

## 2021-06-10 NOTE — CV Procedure (Signed)
    TRANSESOPHAGEAL ECHOCARDIOGRAM   NAME:  Brian Weeks    MRN: [id number] DOB:  02/16/1951    ADMIT DATE: 06/08/2021  INDICATIONS: Atrial fibrillation and Heart failure with reduced EF  PROCEDURE:   Informed consent was obtained prior to the procedure. The risks, benefits and alternatives for the procedure were discussed and the patient comprehended these risks.  Risks include, but are not limited to, cough, sore throat, vomiting, nausea, somnolence, esophageal and stomach trauma or perforation, bleeding, low blood pressure, aspiration, pneumonia, infection, trauma to the teeth and death.    Procedural time out performed. The oropharynx was anesthetized with topical 1% benzocaine.    Anesthesia was administered by Dr. Ennis Forts and team.  The patient was administered a total of Propofol 200 mg, to achieve and maintain moderate to deep conscious sedation.  The patient's heart rate, blood pressure, and oxygen saturation are monitored continuously during the procedure. The period of conscious sedation is  minutes, of which I was present face-to-face 100% of this time.   The transesophageal probe was inserted in the esophagus and stomach without difficulty and multiple views were obtained.   COMPLICATIONS:    There were no immediate complications.  KEY FINDINGS:  Echodensity in the left atrial appendage concerning for clot; did not pursue DCCV for this reason. Hypotension during study; did not pursue transgastric images for this reason Reduced LV function Full report to follow. Further management per primary team. Will reach out to patient and family shortly  Rudean Haskell, MD [place]  9:58 AM

## 2021-06-10 NOTE — Progress Notes (Signed)
PROGRESS NOTE    Brian Weeks  FWY:[id number] DOB: 1951/08/13 DOA: 06/08/2021 PCP: Kristen Loader, FNP   Brief Narrative:  Brian Weeks is a 70 y.o. male with a PMH significant for gout, HLD. He presented from home to the ED on 06/08/2021 with exertional shortness of breath several weeks in the ED, it was found that he had A. fib RVR.  Patient was treated with Cardizem drip and Eliquis.  Patient was admitted to medicine service for further workup and management of new onset A. fib as outlined in detail below.  Assessment & Plan:   Principal Problem:   Atrial fibrillation with RVR (HCC) Active Problems:   Acute CHF (congestive heart failure) (HCC)   AKI (acute kidney injury) (HCC)   Prolonged QT interval  New onset A. fib with RVR: Echo shows 20 to 25% ejection fraction with left ventricular global hypokinesis, remains on amiodarone drip but rates around 120.  Plan was to do TEE cardioversion however reportedly he was found to have possible clot in left atrial appendage so DCCV was not pursued.  Continue current management including Eliquis.  Further management per cardiology.    Acute systolic congestive heart failure: Echo results as above.  Received IV Lasix.  Per cardiology, will be on oral diuretics soon.  Defer to cardiology for further management.   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  His creatinine has remained stable around 1.6 since admission.  No prior results available in the chart for [place] to compare.  I do not think he has AKI.  Hypokalemia: 3.3.  Will replace.   DVT prophylaxis:    Code Status: Full Code  Family Communication:  wife present at bedside.  Plan of care discussed with patient in length and he verbalized understanding and agreed with it.  Status is: Inpatient  Remains inpatient appropriate because: Needs further management.  Estimated body mass index is 30.4 kg/m as calculated from the following:   Height as of this encounter: 5'  11" (1.803 m).   Weight as of this encounter: 98.9 kg.     Nutritional Assessment: Body mass index is 30.4 kg/m.Marland Kitchen Seen by dietician.  I agree with the assessment and plan as outlined below: Nutrition Status:  Skin Assessment: I have examined the patient's skin and I agree with the wound assessment as performed by the wound care RN as outlined below:    Consultants:  Cardiology  Procedures:  TEE  Antimicrobials:  Anti-infectives (From admission, onward)    None          Subjective: Patient seen and examined after he returned from TEE.  Wife at the bedside.  Patient had no complaint.  Heart rate was around 120 and he was still in atrial fibrillation.  Objective: Vitals:   06/10/21 1010 06/10/21 1025 06/10/21 1035 06/10/21 1050  BP: (!) 89/77 99/78 102/78 94/63  Pulse: (!) 110 (!) 110 (!) 128 (!) 127  Resp: (!) 21 20 20    Temp: 97.7 F (36.5 C)  97.7 F (36.5 C) 97.6 F (36.4 C)  TempSrc:    Oral  SpO2: 97% 98% 97% 90%  Weight:      Height:        Intake/Output Summary (Last 24 hours) at 06/10/2021 1234 Last data filed at 06/10/2021 1035 Gross per 24 hour  Intake 1075 ml  Output 1425 ml  Net -350 ml   Filed Weights   06/08/21 2028 06/09/21 2144 06/10/21 0902  Weight: 103 kg  99.1 kg 98.9 kg    Examination:  General exam: Appears calm and comfortable  Respiratory system: Clear to auscultation. Respiratory effort normal. Cardiovascular system: S1 & S2 heard, irregularly irregular rate and rhythm. No JVD, murmurs, rubs, gallops or clicks. No pedal edema. Gastrointestinal system: Abdomen is nondistended, soft and nontender. No organomegaly or masses felt. Normal bowel sounds heard. Central nervous system: Alert and oriented. No focal neurological deficits. Extremities: Symmetric 5 x 5 power. Skin: No rashes, lesions or ulcers Psychiatry: Judgement and insight appear normal. Mood & affect appropriate.    Data Reviewed: I have personally reviewed  following labs and imaging studies  CBC: Recent Labs  Lab 06/08/21 2045 06/09/21 0152  WBC 7.4 7.6  NEUTROABS 5.0  --   HGB 12.1* 13.0  HCT 39.9 42.7  MCV 88.1 87.7  PLT 205 812   Basic Metabolic Panel: Recent Labs  Lab 06/08/21 2045 06/09/21 0152 06/10/21 0832  NA 140 140 139  K 4.4 4.0 3.3*  CL 109 105 104  CO2 21* 24 26  GLUCOSE 107* 146* 102*  BUN 29* 27* 27*  CREATININE 1.53* 1.65* 1.82*  CALCIUM 8.8* 9.2 8.7*  MG 2.1  --  2.0   GFR: Estimated Creatinine Clearance: 45.2 mL/min (A) (by C-G formula based on SCr of 1.82 mg/dL (H)). Liver Function Tests: Recent Labs  Lab 06/08/21 2045  AST 21  ALT 18  ALKPHOS 110  BILITOT 1.1  PROT 7.2  ALBUMIN 3.1*   No results for input(s): LIPASE, AMYLASE in the last 168 hours. No results for input(s): AMMONIA in the last 168 hours. Coagulation Profile: No results for input(s): INR, PROTIME in the last 168 hours. Cardiac Enzymes: No results for input(s): CKTOTAL, CKMB, CKMBINDEX, TROPONINI in the last 168 hours. BNP (last 3 results) No results for input(s): PROBNP in the last 8760 hours. HbA1C: No results for input(s): HGBA1C in the last 72 hours. CBG: No results for input(s): GLUCAP in the last 168 hours. Lipid Profile: Recent Labs    06/09/21 0152  CHOL 111  HDL 32*  LDLCALC 71  TRIG 40  CHOLHDL 3.5   Thyroid Function Tests: Recent Labs    06/08/21 2045  TSH 3.281   Anemia Panel: No results for input(s): VITAMINB12, FOLATE, FERRITIN, TIBC, IRON, RETICCTPCT in the last 72 hours. Sepsis Labs: No results for input(s): PROCALCITON, LATICACIDVEN in the last 168 hours.  Recent Results (from the past 240 hour(s))  Urine Culture     Status: None   Collection Time: 06/08/21  7:37 PM   Specimen: Urine, Clean Catch  Result Value Ref Range Status   Specimen Description URINE, CLEAN CATCH  Final   Special Requests NONE  Final   Culture   Final    NO GROWTH Performed at [facility], [address] [place], [place] [address]    Report Status 06/09/2021 FINAL  Final  Resp Panel by RT-PCR (Flu A&B, Covid) Nasopharyngeal Swab     Status: None   Collection Time: 06/08/21  8:36 PM   Specimen: Nasopharyngeal Swab; Nasopharyngeal(NP) swabs in vial transport medium  Result Value Ref Range Status   SARS Coronavirus 2 by RT PCR NEGATIVE NEGATIVE Final    Comment: (NOTE) SARS-CoV-2 target nucleic acids are NOT DETECTED.  The SARS-CoV-2 RNA is generally detectable in upper respiratory specimens during the acute phase of infection. The lowest concentration of SARS-CoV-2 viral copies this assay can detect is 138 copies/mL. A negative result does not preclude SARS-Cov-2  infection and should not be used as the sole basis for treatment or other patient management decisions. A negative result may occur with  improper specimen collection/handling, submission of specimen other than nasopharyngeal swab, presence of viral mutation(s) within the areas targeted by this assay, and inadequate number of viral copies(<138 copies/mL). A negative result must be combined with clinical observations, patient history, and epidemiological information. The expected result is Negative.  Fact Sheet for Patients:  EntrepreneurPulse.com.au  Fact Sheet for Healthcare Providers:  IncredibleEmployment.be  This test is no t yet approved or cleared by the [place] FDA and  has been authorized for detection and/or diagnosis of SARS-CoV-2 by FDA under an Emergency Use Authorization (EUA). This EUA will remain  in effect (meaning this test can be used) for the duration of the COVID-19 declaration under Section 564(b)(1) of the Act, 21 U.S.C.section 360bbb-3(b)(1), unless the authorization is terminated  or revoked sooner.       Influenza A by PCR NEGATIVE NEGATIVE Final   Influenza B by PCR NEGATIVE NEGATIVE Final    Comment: (NOTE) The Xpert Xpress SARS-CoV-2/FLU/RSV plus  assay is intended as an aid in the diagnosis of influenza from Nasopharyngeal swab specimens and should not be used as a sole basis for treatment. Nasal washings and aspirates are unacceptable for Xpert Xpress SARS-CoV-2/FLU/RSV testing.  Fact Sheet for Patients: EntrepreneurPulse.com.au  Fact Sheet for Healthcare Providers: IncredibleEmployment.be  This test is not yet approved or cleared by the [place] FDA and has been authorized for detection and/or diagnosis of SARS-CoV-2 by FDA under an Emergency Use Authorization (EUA). This EUA will remain in effect (meaning this test can be used) for the duration of the COVID-19 declaration under Section 564(b)(1) of the Act, 21 U.S.C. section 360bbb-3(b)(1), unless the authorization is terminated or revoked.  Performed at [facility], [place] [address] [place], [place] [address]   MRSA Next Gen by PCR, Nasal     Status: None   Collection Time: 06/09/21  9:48 PM   Specimen: Nasal Mucosa; Nasal Swab  Result Value Ref Range Status   MRSA by PCR Next Gen NOT DETECTED NOT DETECTED Final    Comment: (NOTE) The GeneXpert MRSA Assay (FDA approved for NASAL specimens only), is one component of a comprehensive MRSA colonization surveillance program. It is not intended to diagnose MRSA infection nor to guide or monitor treatment for MRSA infections. Test performance is not FDA approved in patients less than 55 years old. Performed at [facility], [place] [address] [place], [place] [address]       Radiology Studies: DG CHEST PORT 1 VIEW  Result Date: 06/08/2021 CLINICAL DATA:  Shortness of breath EXAM: PORTABLE CHEST 1 VIEW COMPARISON:  None FINDINGS: Cardiomegaly. Diffuse bilateral interstitial opacity could be due to interstitial inflammatory process or minimal edema. No pleural effusion. No pneumothorax IMPRESSION: Cardiomegaly with diffuse interstitial opacity which may be due to mild  edema or interstitial inflammatory process. Electronically Signed   By: Donavan Foil M.D.   On: 06/08/2021 22:21   ECHOCARDIOGRAM COMPLETE  Result Date: 06/09/2021    ECHOCARDIOGRAM REPORT   Patient Name:   Brian Weeks [facility] Date of Exam: 06/09/2021 Medical Rec #:  [id number]          Height:       71.0 in Accession #:    [id number]         Weight:       227.0 lb Date of Birth:  05-09-1951  BSA:          2.225 m Patient Age:    42 years           BP:           118/100 mmHg Patient Gender: M                  HR:           108 bpm. Exam Location:  Inpatient Procedure: 2D Echo, Cardiac Doppler, Color Doppler and Intracardiac            Opacification Agent Indications:    Atrial Fibrillation  History:        Patient has no prior history of Echocardiogram examinations.                 Arrythmias:Atrial Fibrillation and Tachycardia.  Sonographer:    Merrie Roof RDCS Referring Phys: [id number] [place]  1. Left ventricular ejection fraction, by estimation, is 20 to 25%. The left ventricle has severely decreased function. The left ventricle demonstrates global hypokinesis. The left ventricular internal cavity size was mildly dilated. Left ventricular diastolic function could not be evaluated.  2. Right ventricular systolic function is moderately reduced. The right ventricular size is normal. There is moderately elevated pulmonary artery systolic pressure. The estimated right ventricular systolic pressure is 18.2 mmHg.  3. Left atrial size was mildly dilated.  4. Right atrial size was mildly dilated.  5. The mitral valve is grossly normal. Mild to moderate mitral valve regurgitation. No evidence of mitral stenosis.  6. Tricuspid valve regurgitation is moderate.  7. The aortic valve is tricuspid. Aortic valve regurgitation is not visualized. No aortic stenosis is present.  8. The inferior vena cava is dilated in size with <50% respiratory variability, suggesting right atrial pressure of 15  mmHg. FINDINGS  Left Ventricle: Left ventricular ejection fraction, by estimation, is 20 to 25%. The left ventricle has severely decreased function. The left ventricle demonstrates global hypokinesis. Definity contrast agent was given IV to delineate the left ventricular endocardial borders. The left ventricular internal cavity size was mildly dilated. There is no left ventricular hypertrophy. Left ventricular diastolic function could not be evaluated due to atrial fibrillation. Left ventricular diastolic function could not be evaluated. Right Ventricle: The right ventricular size is normal. No increase in right ventricular wall thickness. Right ventricular systolic function is moderately reduced. There is moderately elevated pulmonary artery systolic pressure. The tricuspid regurgitant velocity is 2.86 m/s, and with an assumed right atrial pressure of 15 mmHg, the estimated right ventricular systolic pressure is 99.3 mmHg. Left Atrium: Left atrial size was mildly dilated. Right Atrium: Right atrial size was mildly dilated. Pericardium: There is no evidence of pericardial effusion. Mitral Valve: The mitral valve is grossly normal. Mild to moderate mitral valve regurgitation. No evidence of mitral valve stenosis. Tricuspid Valve: The tricuspid valve is grossly normal. Tricuspid valve regurgitation is moderate . No evidence of tricuspid stenosis. Aortic Valve: The aortic valve is tricuspid. Aortic valve regurgitation is not visualized. No aortic stenosis is present. Aortic valve mean gradient measures 2.0 mmHg. Aortic valve peak gradient measures 3.7 mmHg. Aortic valve area, by VTI measures 1.48 cm. Pulmonic Valve: The pulmonic valve was grossly normal. Pulmonic valve regurgitation is not visualized. No evidence of pulmonic stenosis. Aorta: The aortic root and ascending aorta are structurally normal, with no evidence of dilitation. Venous: The inferior vena cava is dilated in size with less than 50% respiratory  variability, suggesting  right atrial pressure of 15 mmHg. IAS/Shunts: The atrial septum is grossly normal.  LEFT VENTRICLE PLAX 2D LVIDd:         5.80 cm LVIDs:         5.20 cm LV PW:         0.97 cm LV IVS:        0.83 cm LVOT diam:     2.10 cm LV SV:         25 LV SV Index:   11 LVOT Area:     3.46 cm  RIGHT VENTRICLE             IVC RV Basal diam:  3.80 cm     IVC diam: 2.40 cm RV S prime:     12.70 cm/s TAPSE (M-mode): 1.6 cm LEFT ATRIUM              Index        RIGHT ATRIUM           Index LA diam:        4.70 cm  2.11 cm/m   RA Area:     29.10 cm LA Vol (A2C):   105.0 ml 47.19 ml/m  RA Volume:   114.00 ml 51.23 ml/m LA Vol (A4C):   64.4 ml  28.94 ml/m LA Biplane Vol: 81.8 ml  36.76 ml/m  AORTIC VALVE AV Area (Vmax):    2.03 cm AV Area (Vmean):   1.79 cm AV Area (VTI):     1.48 cm AV Vmax:           96.50 cm/s AV Vmean:          73.000 cm/s AV VTI:            0.172 m AV Peak Grad:      3.7 mmHg AV Mean Grad:      2.0 mmHg LVOT Vmax:         56.60 cm/s LVOT Vmean:        37.700 cm/s LVOT VTI:          0.074 m LVOT/AV VTI ratio: 0.43  AORTA Ao Root diam: 3.05 cm Ao Asc diam:  3.10 cm TRICUSPID VALVE TR Peak grad:   32.7 mmHg TR Vmax:        286.00 cm/s  SHUNTS Systemic VTI:  0.07 m Systemic Diam: 2.10 cm Eleonore Chiquito MD Electronically signed by Eleonore Chiquito MD Signature Date/Time: 06/09/2021/1:24:43 PM    Final    ECHO TEE  Result Date: 06/10/2021    TRANSESOPHOGEAL ECHO REPORT   Patient Name:   Brian Weeks [facility] Date of Exam: 06/10/2021 Medical Rec #:  [id number]          Height:       71.0 in Accession #:    [id number]         Weight:       218.0 lb Date of Birth:  06/09/1951          BSA:          2.187 m Patient Age:    63 years           BP:           94/63 mmHg Patient Gender: M                  HR:           127 bpm. Exam Location:  Inpatient Procedure: 3D Echo, Transesophageal Echo, Cardiac Doppler and  Color Doppler Indications:     Atrial Fibrilation  History:         Patient has prior  history of Echocardiogram examinations, most                  recent 06/09/2021.  Sonographer:     Philipp Deputy RDCS Referring Phys:  [id number] [facility]ERT[facility] Diagnosing Phys: Rudean Haskell MD PROCEDURE: After discussion of the risks and benefits of a TEE, an informed consent was obtained from the patient. The transesophogeal probe was passed without difficulty through the esophogus of the patient. Imaged were obtained with the patient in a left lateral decubitus position. Sedation performed by different physician. The patient was monitored while under deep sedation. Anesthestetic sedation was provided intravenously by Anesthesiology: 203mg  of Propofol. Image quality was adequate. The patient developed no complications during the procedure. IMPRESSIONS  1. There is linear echodensity in the left atrial appendage with heavy smoke and stalk attachment. Contrast study done; though not classical appearnce in the clinical syndrome of atrial fibrillation did not proceed to cardioversion.  2. Left ventricular ejection fraction, by estimation, is 20 to 25%. The left ventricle has severely decreased function. The left ventricle demonstrates global hypokinesis.  3. Right ventricular systolic function is normal. The right ventricular size is normal.  4. Left atrial size was moderately dilated. A left atrial/left atrial appendage thrombus was detected.  5. The mitral valve is normal in structure. Mild to moderate mitral valve regurgitation. No evidence of mitral stenosis.  6. The aortic valve is tricuspid. Aortic valve regurgitation is not visualized. FINDINGS  Left Ventricle: Left ventricular ejection fraction, by estimation, is 20 to 25%. The left ventricle has severely decreased function. The left ventricle demonstrates global hypokinesis. Definity contrast agent was given IV to delineate the left ventricular endocardial borders. The left ventricular internal cavity size was normal in size. Right Ventricle: The  right ventricular size is normal. No increase in right ventricular wall thickness. Right ventricular systolic function is normal. Left Atrium: Left atrial size was moderately dilated. A left atrial/left atrial appendage thrombus was detected. Right Atrium: Right atrial size was normal in size. Pericardium: There is no evidence of pericardial effusion. Mitral Valve: The mitral valve is normal in structure. Mild to moderate mitral valve regurgitation. No evidence of mitral valve stenosis. Tricuspid Valve: The tricuspid valve is normal in structure. Tricuspid valve regurgitation is mild. Aortic Valve: The aortic valve is tricuspid. Aortic valve regurgitation is not visualized. Pulmonic Valve: The pulmonic valve was normal in structure. Pulmonic valve regurgitation is not visualized. Aorta: The aortic root and ascending aorta are structurally normal, with no evidence of dilitation. IAS/Shunts: The atrial septum is grossly normal. Rudean Haskell MD Electronically signed by Rudean Haskell MD Signature Date/Time: 06/10/2021/11:54:59 AM    Final     Scheduled Meds:  apixaban  5 mg Oral BID   influenza vaccine adjuvanted  0.5 mL Intramuscular Tomorrow-1000   potassium chloride  40 mEq Oral Once   sodium chloride flush  3 mL Intravenous Q12H   Continuous Infusions:  sodium chloride     amiodarone 30 mg/hr (06/10/21 0504)     LOS: 2 days   Time spent: 35 minutes   Darliss Cheney, MD [facility]  06/10/2021, 12:34 PM  Please page via [facility] and do not message via secure chat for anything urgent. Secure chat can be used for anything non urgent.  How to contact the [facility] Attending or Consulting provider [place] or covering  provider during after hours [place], for this patient?  Check the care team in [facility] and look for a) attending/consulting [facility] provider listed and b) the [facility] team listed. Page or secure chat [phone]. Log into [email or web address] and use [place]'s universal password to access. If  you do not have the password, please contact the hospital operator. Locate the [facility] provider you are looking for under [facility] and page to a number that you can be directly reached. If you still have difficulty reaching the provider, please page the [facility] (Director on Call) for the Hospitalists listed on amion for assistance.

## 2021-06-10 NOTE — Progress Notes (Signed)
Progress Note  Patient Name: Brian Weeks Date of Encounter: 06/10/2021  [facility] Cardiologist: None   Subjective   No chest pain or shortness of breath. Slept ok last night. No complaints this morning.   Inpatient Medications    Scheduled Meds:  apixaban  5 mg Oral BID   influenza vaccine adjuvanted  0.5 mL Intramuscular Tomorrow-1000   sodium chloride flush  3 mL Intravenous Q12H   Continuous Infusions:  sodium chloride     amiodarone 30 mg/hr (06/10/21 0504)   PRN Meds: sodium chloride, acetaminophen **OR** acetaminophen, sodium chloride flush   Vital Signs    Vitals:   06/09/21 2144 06/09/21 2331 06/10/21 0301 06/10/21 0400  BP: (!) 126/98 109/82 111/89 116/86  Pulse: (!) 126 (!) 110 (!) 126 (!) 140  Resp: 18 20 20 20   Temp: 98 F (36.7 C) 98.5 F (36.9 C) 98.1 F (36.7 C) 98.1 F (36.7 C)  TempSrc: Oral Oral Oral Oral  SpO2: 95% 92% 96% 94%  Weight: 99.1 kg     Height: 5\' 11"  (1.803 m)       Intake/Output Summary (Last 24 hours) at 06/10/2021 0742 Last data filed at 06/10/2021 0504 Gross per 24 hour  Intake 560 ml  Output 1425 ml  Net -865 ml   Last 3 Weights 06/09/2021 06/08/2021 10/27/2007  Weight (lbs) 218 lb 7.6 oz 227 lb 217 lb  Weight (kg) 99.1 kg 102.967 kg 98.431 kg      Telemetry    Atrial fibrillation heart rate 120's - Personally Reviewed    Physical Exam  Alert, oriented, NAD. Laying flat in supine position breathing comfortably.  GEN: No acute distress.   Neck: No JVD Cardiac: irregularly irregular, no murmurs, rubs, or gallops.  Respiratory: Clear to auscultation bilaterally. GI: Soft, nontender, non-distended  MS: No edema; No deformity. Neuro:  Nonfocal  Psych: Normal affect   Labs    High Sensitivity Troponin:   Recent Labs  Lab 06/08/21 2045 06/08/21 2137 06/09/21 0034 06/09/21 0152  TROPONINIHS 16 15 13 17      Chemistry Recent Labs  Lab 06/08/21 2045 06/09/21 0152  NA 140 140  K 4.4 4.0   CL 109 105  CO2 21* 24  GLUCOSE 107* 146*  BUN 29* 27*  CREATININE 1.53* 1.65*  CALCIUM 8.8* 9.2  MG 2.1  --   PROT 7.2  --   ALBUMIN 3.1*  --   AST 21  --   ALT 18  --   ALKPHOS 110  --   BILITOT 1.1  --   GFRNONAA 49* 44*  ANIONGAP 10 11    Lipids  Recent Labs  Lab 06/09/21 0152  CHOL 111  TRIG 40  HDL 32*  LDLCALC 71  CHOLHDL 3.5    Hematology Recent Labs  Lab 06/08/21 2045 06/09/21 0152  WBC 7.4 7.6  RBC 4.53 4.87  HGB 12.1* 13.0  HCT 39.9 42.7  MCV 88.1 87.7  MCH 26.7 26.7  MCHC 30.3 30.4  RDW 15.8* 15.7*  PLT 205 206   Thyroid  Recent Labs  Lab 06/08/21 2045  TSH 3.281    BNP Recent Labs  Lab 06/08/21 2045  BNP 1,105.3*    DDimer No results for input(s): DDIMER in the last 168 hours.   Radiology    DG CHEST PORT 1 VIEW  Result Date: 06/08/2021 CLINICAL DATA:  Shortness of breath EXAM: PORTABLE CHEST 1 VIEW COMPARISON:  None FINDINGS: Cardiomegaly. Diffuse bilateral interstitial opacity could be  due to interstitial inflammatory process or minimal edema. No pleural effusion. No pneumothorax IMPRESSION: Cardiomegaly with diffuse interstitial opacity which may be due to mild edema or interstitial inflammatory process. Electronically Signed   By: Donavan Foil M.D.   On: 06/08/2021 22:21   ECHOCARDIOGRAM COMPLETE  Result Date: 06/09/2021    ECHOCARDIOGRAM REPORT   Patient Name:   Brian Weeks [facility] Date of Exam: 06/09/2021 Medical Rec #:  [id number]          Height:       71.0 in Accession #:    [id number]         Weight:       227.0 lb Date of Birth:  02-17-1951          BSA:          2.225 m Patient Age:    70 years           BP:           118/100 mmHg Patient Gender: M                  HR:           108 bpm. Exam Location:  Inpatient Procedure: 2D Echo, Cardiac Doppler, Color Doppler and Intracardiac            Opacification Agent Indications:    Atrial Fibrillation  History:        Patient has no prior history of Echocardiogram examinations.                  Arrythmias:Atrial Fibrillation and Tachycardia.  Sonographer:    Merrie Roof RDCS Referring Phys: [id number] [place]  1. Left ventricular ejection fraction, by estimation, is 20 to 25%. The left ventricle has severely decreased function. The left ventricle demonstrates global hypokinesis. The left ventricular internal cavity size was mildly dilated. Left ventricular diastolic function could not be evaluated.  2. Right ventricular systolic function is moderately reduced. The right ventricular size is normal. There is moderately elevated pulmonary artery systolic pressure. The estimated right ventricular systolic pressure is 27.7 mmHg.  3. Left atrial size was mildly dilated.  4. Right atrial size was mildly dilated.  5. The mitral valve is grossly normal. Mild to moderate mitral valve regurgitation. No evidence of mitral stenosis.  6. Tricuspid valve regurgitation is moderate.  7. The aortic valve is tricuspid. Aortic valve regurgitation is not visualized. No aortic stenosis is present.  8. The inferior vena cava is dilated in size with <50% respiratory variability, suggesting right atrial pressure of 15 mmHg. FINDINGS  Left Ventricle: Left ventricular ejection fraction, by estimation, is 20 to 25%. The left ventricle has severely decreased function. The left ventricle demonstrates global hypokinesis. Definity contrast agent was given IV to delineate the left ventricular endocardial borders. The left ventricular internal cavity size was mildly dilated. There is no left ventricular hypertrophy. Left ventricular diastolic function could not be evaluated due to atrial fibrillation. Left ventricular diastolic function could not be evaluated. Right Ventricle: The right ventricular size is normal. No increase in right ventricular wall thickness. Right ventricular systolic function is moderately reduced. There is moderately elevated pulmonary artery systolic pressure. The tricuspid regurgitant  velocity is 2.86 m/s, and with an assumed right atrial pressure of 15 mmHg, the estimated right ventricular systolic pressure is 41.2 mmHg. Left Atrium: Left atrial size was mildly dilated. Right Atrium: Right atrial size was mildly dilated. Pericardium: There is no evidence of pericardial  effusion. Mitral Valve: The mitral valve is grossly normal. Mild to moderate mitral valve regurgitation. No evidence of mitral valve stenosis. Tricuspid Valve: The tricuspid valve is grossly normal. Tricuspid valve regurgitation is moderate . No evidence of tricuspid stenosis. Aortic Valve: The aortic valve is tricuspid. Aortic valve regurgitation is not visualized. No aortic stenosis is present. Aortic valve mean gradient measures 2.0 mmHg. Aortic valve peak gradient measures 3.7 mmHg. Aortic valve area, by VTI measures 1.48 cm. Pulmonic Valve: The pulmonic valve was grossly normal. Pulmonic valve regurgitation is not visualized. No evidence of pulmonic stenosis. Aorta: The aortic root and ascending aorta are structurally normal, with no evidence of dilitation. Venous: The inferior vena cava is dilated in size with less than 50% respiratory variability, suggesting right atrial pressure of 15 mmHg. IAS/Shunts: The atrial septum is grossly normal.  LEFT VENTRICLE PLAX 2D LVIDd:         5.80 cm LVIDs:         5.20 cm LV PW:         0.97 cm LV IVS:        0.83 cm LVOT diam:     2.10 cm LV SV:         25 LV SV Index:   11 LVOT Area:     3.46 cm  RIGHT VENTRICLE             IVC RV Basal diam:  3.80 cm     IVC diam: 2.40 cm RV S prime:     12.70 cm/s TAPSE (M-mode): 1.6 cm LEFT ATRIUM              Index        RIGHT ATRIUM           Index LA diam:        4.70 cm  2.11 cm/m   RA Area:     29.10 cm LA Vol (A2C):   105.0 ml 47.19 ml/m  RA Volume:   114.00 ml 51.23 ml/m LA Vol (A4C):   64.4 ml  28.94 ml/m LA Biplane Vol: 81.8 ml  36.76 ml/m  AORTIC VALVE AV Area (Vmax):    2.03 cm AV Area (Vmean):   1.79 cm AV Area (VTI):     1.48  cm AV Vmax:           96.50 cm/s AV Vmean:          73.000 cm/s AV VTI:            0.172 m AV Peak Grad:      3.7 mmHg AV Mean Grad:      2.0 mmHg LVOT Vmax:         56.60 cm/s LVOT Vmean:        37.700 cm/s LVOT VTI:          0.074 m LVOT/AV VTI ratio: 0.43  AORTA Ao Root diam: 3.05 cm Ao Asc diam:  3.10 cm TRICUSPID VALVE TR Peak grad:   32.7 mmHg TR Vmax:        286.00 cm/s  SHUNTS Systemic VTI:  0.07 m Systemic Diam: 2.10 cm Eleonore Chiquito MD Electronically signed by Eleonore Chiquito MD Signature Date/Time: 06/09/2021/1:24:43 PM    Final     Cardiac Studies   Echo as above  Patient Profile     70 y.o. male with new onset atrial fibrillation of unknown duration, severe LV dysfunction, and acute systolic heart failure  Assessment & Plan  Atrial fibrillation with RVR, new-onset of unknown duration: appropriately started on apixaban. Now on IV amiodarone in light of severe LV dysfunction. Remains in AF with RVR, HR in 120's. Recommend TEE-guided DCCV today. Tentatively planned for this afternoon pending availability of endoscopy/anesthesia. Reviewed risks, indications, alternatives of both TEE and cardioversion with the patient who expressed understanding and provides full informed consent for the procedures. He will have received 3 doses of apixaban. Acute systolic heart failure: LVEF 20-25%, likely tachycardia mediated. Consider ischemic evaluation in future if no improvement in LVEF with restoration of sinus rhythm. No anginal symptoms and normal troponin in setting of CHF and CKD makes CAD unlikely. Start GDMT as we convert him to oral diuretic therapy. BP soft will hold off today as we plan on proceeding with cardioversion.  CKD 3: needs updated BMET today. On IV diuretic therapy. Will order Other problems per primary team.      For questions or updates, please contact [facility] Please consult [email or web address] for contact info under        Signed, Sherren Mocha, MD  06/10/2021, 7:42  AM

## 2021-06-10 NOTE — Transfer of Care (Signed)
Immediate Anesthesia Transfer of Care Note  Patient: Brian Weeks  Procedure(s) Performed: TRANSESOPHAGEAL ECHOCARDIOGRAM (TEE)  Patient Location: PACU  Anesthesia Type:MAC  Level of Consciousness: drowsy  Airway & Oxygen Therapy: Patient Spontanous Breathing and Patient connected to nasal cannula oxygen  Post-op Assessment: Report given to RN and Post -op Vital signs reviewed and stable  Post vital signs: Reviewed and stable  Last Vitals:  Vitals Value Taken Time  BP    Temp    Pulse    Resp    SpO2      Last Pain:  Vitals:   06/10/21 0902  TempSrc: Temporal  PainSc: 0-No pain         Complications: No notable events documented.

## 2021-06-11 ENCOUNTER — Encounter (HOSPITAL_COMMUNITY): Payer: Self-pay | Admitting: Internal Medicine

## 2021-06-11 ENCOUNTER — Encounter: Payer: Medicare Other | Admitting: Skilled Nursing Facility1

## 2021-06-11 DIAGNOSIS — I5021 Acute systolic (congestive) heart failure: Secondary | ICD-10-CM | POA: Diagnosis not present

## 2021-06-11 DIAGNOSIS — I4891 Unspecified atrial fibrillation: Secondary | ICD-10-CM | POA: Diagnosis not present

## 2021-06-11 LAB — BASIC METABOLIC PANEL
Anion gap: 9 (ref 5–15)
BUN: 30 mg/dL — ABNORMAL HIGH (ref 8–23)
CO2: 24 mmol/L (ref 22–32)
Calcium: 8.6 mg/dL — ABNORMAL LOW (ref 8.9–10.3)
Chloride: 103 mmol/L (ref 98–111)
Creatinine, Ser: 1.88 mg/dL — ABNORMAL HIGH (ref 0.61–1.24)
GFR, Estimated: 38 mL/min — ABNORMAL LOW (ref >60)
Glucose, Bld: 102 mg/dL — ABNORMAL HIGH (ref 70–99)
Potassium: 4.3 mmol/L (ref 3.5–5.1)
Sodium: 136 mmol/L (ref 135–145)

## 2021-06-11 LAB — HEMOGLOBIN A1C
Hgb A1c MFr Bld: 6.1 % — ABNORMAL HIGH (ref 4.8–5.6)
Mean Plasma Glucose: 128.37 mg/dL

## 2021-06-11 MED ORDER — METOPROLOL SUCCINATE ER 25 MG PO TB24
25.0000 mg | ORAL_TABLET | Freq: Every day | ORAL | Status: DC
  Administered 2021-06-11 – 2021-06-12 (×2): 25 mg via ORAL
  Filled 2021-06-11 (×2): qty 1

## 2021-06-11 MED ORDER — DIGOXIN 125 MCG PO TABS
0.1250 mg | ORAL_TABLET | Freq: Every day | ORAL | Status: DC
  Administered 2021-06-11 – 2021-06-15 (×5): 0.125 mg via ORAL
  Filled 2021-06-11 (×5): qty 1

## 2021-06-11 NOTE — Care Management Important Message (Signed)
Important Message  Patient Details  Name: Brian Weeks MRN: [id number] Date of Birth: 28-Nov-1950   Medicare Important Message Given:  Yes     Orbie Pyo 06/11/2021, 2:34 PM

## 2021-06-11 NOTE — Evaluation (Signed)
Occupational Therapy Evaluation Patient Details Name: Brian Weeks MRN: [id number] DOB: 1950-09-11 Today's Date: 06/11/2021   History of Present Illness 70 y.o. male with a PMH significant for gout, HLD.  He presented with exertional shortness of breath several weeks, he was found that he had A. fib RVR.   Clinical Impression   Patient admitted with the above diagnosis.  PTA he lives with his spouse, who is able to assist as needed.  The patient was independent with all mobility and ADL/IADL care.  Deficits are SOB and generalized malaise.  Currenlty he is needing minimal supervision for in room mobility and ADL completion due to line management.  It is unclear when discharge will be, so OT will follow in the acute setting to ensure a safe discharge home.  No post acute OT is anticipated.        Recommendations for follow up therapy are one component of a multi-disciplinary discharge planning process, led by the attending physician.  Recommendations may be updated based on patient status, additional functional criteria and insurance authorization.   Follow Up Recommendations  No OT follow up    Assistance Recommended at Discharge PRN  Functional Status Assessment  Patient has not had a recent decline in their functional status  Equipment Recommendations  None recommended by OT    Recommendations for Other Services       Precautions / Restrictions Precautions Precautions: Other (comment) Precaution Comments: Watch HR and O2 sats Restrictions Weight Bearing Restrictions: No      Mobility Bed Mobility Overal bed mobility: Modified Independent               Patient Response: Cooperative  Transfers Overall transfer level: Modified independent                        Balance Overall balance assessment: Mild deficits observed, not formally tested                                         ADL either performed or assessed with clinical  judgement   ADL Overall ADL's : Modified independent                                       General ADL Comments: No assist, but increased SOB noted.  Cues for pursed lip breathing and therapeutic breaks.     Vision Patient Visual Report: No change from baseline       Perception Perception Perception: Not tested   Praxis Praxis Praxis: Not tested    Pertinent Vitals/Pain Pain Assessment: No/denies pain     Hand Dominance Right   Extremity/Trunk Assessment Upper Extremity Assessment Upper Extremity Assessment: Overall WFL for tasks assessed   Lower Extremity Assessment Lower Extremity Assessment: Defer to PT evaluation   Cervical / Trunk Assessment Cervical / Trunk Assessment: Normal   Communication Communication Communication: No difficulties   Cognition Arousal/Alertness: Awake/alert Behavior During Therapy: WFL for tasks assessed/performed Overall Cognitive Status: Within Functional Limits for tasks assessed                                       General Comments   Off O2 he did desat to  Mid 80's for saturation levels, but would rebound to 95 to 100 on RA.  HR ranged 120-133 BPM with no adverse feelings.      Exercises     Shoulder Instructions      Home Living Family/patient expects to be discharged to:: Private residence Living Arrangements: Spouse/significant other Available Help at Discharge: Family;Available 24 hours/day Type of Home: House Home Access: Level entry     Home Layout: One level     Bathroom Shower/Tub: Teacher, early years/pre: Standard Bathroom Accessibility: Yes How Accessible: Accessible via walker Home Equipment: Shower seat          Prior Functioning/Environment Prior Level of Function : Independent/Modified Independent             Mobility Comments: Walks without an AD, drives. ADLs Comments: No assist with ADL and IADL.  Helps spouse with home management.        OT  Problem List: Decreased activity tolerance      OT Treatment/Interventions: Self-care/ADL training;Therapeutic activities;Balance training;Patient/family education;Energy conservation    OT Goals(Current goals can be found in the care plan section) Acute Rehab OT Goals Patient Stated Goal: Get back home OT Goal Formulation: With patient Time For Goal Achievement: 06/25/21 Potential to Achieve Goals: Good  OT Frequency: Min 1X/week   Barriers to D/C:  None noted          Co-evaluation              AM-PAC OT "6 Clicks" Daily Activity     Outcome Measure Help from another person eating meals?: None Help from another person taking care of personal grooming?: None Help from another person toileting, which includes using toliet, bedpan, or urinal?: None Help from another person bathing (including washing, rinsing, drying)?: None Help from another person to put on and taking off regular upper body clothing?: None Help from another person to put on and taking off regular lower body clothing?: None 6 Click Score: 24   End of Session Equipment Utilized During Treatment: Gait belt Nurse Communication: Mobility status  Activity Tolerance: Patient tolerated treatment well Patient left: in chair;with call bell/phone within reach;with family/visitor present  OT Visit Diagnosis: Other (comment) (Afib with RVR and SOB)                Time: 0865-7846 OT Time Calculation (min): 23 min Charges:  OT General Charges $OT Visit: 1 Visit OT Evaluation $OT Eval Moderate Complexity: 1 Mod OT Treatments $Self Care/Home Management : 8-22 mins  06/11/2021  RP, OTR/L  Acute Rehabilitation Services  Office:  [phone]   Metta Clines 06/11/2021, 12:53 PM

## 2021-06-11 NOTE — Progress Notes (Signed)
Progress Note  Patient Name: Brian Weeks Date of Encounter: 06/11/2021  [facility] Cardiologist: None   Subjective   Patient feels weak today.  States it is harder for him to raise his arms up in the air.  No chest pain or shortness of breath.  Events of yesterday reviewed with TEE demonstrating left atrial appendage thrombus and heavy smoke.  Cardioversion was not performed.  Inpatient Medications    Scheduled Meds:  apixaban  5 mg Oral BID   influenza vaccine adjuvanted  0.5 mL Intramuscular Tomorrow-1000   sodium chloride flush  3 mL Intravenous Q12H   Continuous Infusions:  sodium chloride     amiodarone 30 mg/hr (06/11/21 0443)   PRN Meds: sodium chloride, acetaminophen **OR** acetaminophen, sodium chloride flush   Vital Signs    Vitals:   06/10/21 1556 06/10/21 1939 06/10/21 2304 06/11/21 0302  BP: 101/68 105/87 104/88 113/85  Pulse: (!) 126 (!) 114 (!) 120 (!) 120  Resp:  18 20 20   Temp:  98.6 F (37 C) 98.2 F (36.8 C) 98.4 F (36.9 C)  TempSrc:  Oral Oral Oral  SpO2: 93% 96% 94% 97%  Weight:      Height:        Intake/Output Summary (Last 24 hours) at 06/11/2021 0624 Last data filed at 06/11/2021 0454 Gross per 24 hour  Intake 1049.54 ml  Output 1100 ml  Net -50.46 ml   Last 3 Weights 06/10/2021 06/09/2021 06/08/2021  Weight (lbs) 218 lb 218 lb 7.6 oz 227 lb  Weight (kg) 98.884 kg 99.1 kg 102.967 kg      Telemetry    Atrial fibrillation heart rate 120s- Personally Reviewed   Physical Exam  Alert, oriented, no distress GEN: No acute distress.   Neck: No JVD Cardiac: Irregularly irregular, tachycardic, no murmurs, rubs, or gallops.  Respiratory: Clear to auscultation bilaterally. GI: Soft, nontender, non-distended  MS: No edema; No deformity. Neuro:  Nonfocal  Psych: Normal affect   Labs    High Sensitivity Troponin:   Recent Labs  Lab 06/08/21 2045 06/08/21 2137 06/09/21 0034 06/09/21 0152  TROPONINIHS 16 15 13 17       Chemistry Recent Labs  Lab 06/08/21 2045 06/09/21 0152 06/10/21 0832 06/11/21 0040  NA 140 140 139 136  K 4.4 4.0 3.3* 4.3  CL 109 105 104 103  CO2 21* 24 26 24   GLUCOSE 107* 146* 102* 102*  BUN 29* 27* 27* 30*  CREATININE 1.53* 1.65* 1.82* 1.88*  CALCIUM 8.8* 9.2 8.7* 8.6*  MG 2.1  --  2.0  --   PROT 7.2  --   --   --   ALBUMIN 3.1*  --   --   --   AST 21  --   --   --   ALT 18  --   --   --   ALKPHOS 110  --   --   --   BILITOT 1.1  --   --   --   GFRNONAA 49* 44* 39* 38*  ANIONGAP 10 11 9 9     Lipids  Recent Labs  Lab 06/09/21 0152  CHOL 111  TRIG 40  HDL 32*  LDLCALC 71  CHOLHDL 3.5    Hematology Recent Labs  Lab 06/08/21 2045 06/09/21 0152  WBC 7.4 7.6  RBC 4.53 4.87  HGB 12.1* 13.0  HCT 39.9 42.7  MCV 88.1 87.7  MCH 26.7 26.7  MCHC 30.3 30.4  RDW 15.8* 15.7*  PLT  205 206   Thyroid  Recent Labs  Lab 06/08/21 2045  TSH 3.281    BNP Recent Labs  Lab 06/08/21 2045  BNP 1,105.3*    DDimer No results for input(s): DDIMER in the last 168 hours.   Radiology    ECHOCARDIOGRAM COMPLETE  Result Date: 06/09/2021    ECHOCARDIOGRAM REPORT   Patient Name:   Brian Weeks [facility] Date of Exam: 06/09/2021 Medical Rec #:  [id number]          Height:       71.0 in Accession #:    [id number]         Weight:       227.0 lb Date of Birth:  11-08-1950          BSA:          2.225 m Patient Age:    70 years           BP:           118/100 mmHg Patient Gender: M                  HR:           108 bpm. Exam Location:  Inpatient Procedure: 2D Echo, Cardiac Doppler, Color Doppler and Intracardiac            Opacification Agent Indications:    Atrial Fibrillation  History:        Patient has no prior history of Echocardiogram examinations.                 Arrythmias:Atrial Fibrillation and Tachycardia.  Sonographer:    Merrie Roof RDCS Referring Phys: [id number] [place]  1. Left ventricular ejection fraction, by estimation, is 20 to 25%. The left  ventricle has severely decreased function. The left ventricle demonstrates global hypokinesis. The left ventricular internal cavity size was mildly dilated. Left ventricular diastolic function could not be evaluated.  2. Right ventricular systolic function is moderately reduced. The right ventricular size is normal. There is moderately elevated pulmonary artery systolic pressure. The estimated right ventricular systolic pressure is 45.8 mmHg.  3. Left atrial size was mildly dilated.  4. Right atrial size was mildly dilated.  5. The mitral valve is grossly normal. Mild to moderate mitral valve regurgitation. No evidence of mitral stenosis.  6. Tricuspid valve regurgitation is moderate.  7. The aortic valve is tricuspid. Aortic valve regurgitation is not visualized. No aortic stenosis is present.  8. The inferior vena cava is dilated in size with <50% respiratory variability, suggesting right atrial pressure of 15 mmHg. FINDINGS  Left Ventricle: Left ventricular ejection fraction, by estimation, is 20 to 25%. The left ventricle has severely decreased function. The left ventricle demonstrates global hypokinesis. Definity contrast agent was given IV to delineate the left ventricular endocardial borders. The left ventricular internal cavity size was mildly dilated. There is no left ventricular hypertrophy. Left ventricular diastolic function could not be evaluated due to atrial fibrillation. Left ventricular diastolic function could not be evaluated. Right Ventricle: The right ventricular size is normal. No increase in right ventricular wall thickness. Right ventricular systolic function is moderately reduced. There is moderately elevated pulmonary artery systolic pressure. The tricuspid regurgitant velocity is 2.86 m/s, and with an assumed right atrial pressure of 15 mmHg, the estimated right ventricular systolic pressure is 09.9 mmHg. Left Atrium: Left atrial size was mildly dilated. Right Atrium: Right atrial size was  mildly dilated. Pericardium: There is no evidence  of pericardial effusion. Mitral Valve: The mitral valve is grossly normal. Mild to moderate mitral valve regurgitation. No evidence of mitral valve stenosis. Tricuspid Valve: The tricuspid valve is grossly normal. Tricuspid valve regurgitation is moderate . No evidence of tricuspid stenosis. Aortic Valve: The aortic valve is tricuspid. Aortic valve regurgitation is not visualized. No aortic stenosis is present. Aortic valve mean gradient measures 2.0 mmHg. Aortic valve peak gradient measures 3.7 mmHg. Aortic valve area, by VTI measures 1.48 cm. Pulmonic Valve: The pulmonic valve was grossly normal. Pulmonic valve regurgitation is not visualized. No evidence of pulmonic stenosis. Aorta: The aortic root and ascending aorta are structurally normal, with no evidence of dilitation. Venous: The inferior vena cava is dilated in size with less than 50% respiratory variability, suggesting right atrial pressure of 15 mmHg. IAS/Shunts: The atrial septum is grossly normal.  LEFT VENTRICLE PLAX 2D LVIDd:         5.80 cm LVIDs:         5.20 cm LV PW:         0.97 cm LV IVS:        0.83 cm LVOT diam:     2.10 cm LV SV:         25 LV SV Index:   11 LVOT Area:     3.46 cm  RIGHT VENTRICLE             IVC RV Basal diam:  3.80 cm     IVC diam: 2.40 cm RV S prime:     12.70 cm/s TAPSE (M-mode): 1.6 cm LEFT ATRIUM              Index        RIGHT ATRIUM           Index LA diam:        4.70 cm  2.11 cm/m   RA Area:     29.10 cm LA Vol (A2C):   105.0 ml 47.19 ml/m  RA Volume:   114.00 ml 51.23 ml/m LA Vol (A4C):   64.4 ml  28.94 ml/m LA Biplane Vol: 81.8 ml  36.76 ml/m  AORTIC VALVE AV Area (Vmax):    2.03 cm AV Area (Vmean):   1.79 cm AV Area (VTI):     1.48 cm AV Vmax:           96.50 cm/s AV Vmean:          73.000 cm/s AV VTI:            0.172 m AV Peak Grad:      3.7 mmHg AV Mean Grad:      2.0 mmHg LVOT Vmax:         56.60 cm/s LVOT Vmean:        37.700 cm/s LVOT VTI:           0.074 m LVOT/AV VTI ratio: 0.43  AORTA Ao Root diam: 3.05 cm Ao Asc diam:  3.10 cm TRICUSPID VALVE TR Peak grad:   32.7 mmHg TR Vmax:        286.00 cm/s  SHUNTS Systemic VTI:  0.07 m Systemic Diam: 2.10 cm Eleonore Chiquito MD Electronically signed by Eleonore Chiquito MD Signature Date/Time: 06/09/2021/1:24:43 PM    Final    ECHO TEE  Result Date: 06/10/2021    TRANSESOPHOGEAL ECHO REPORT   Patient Name:   Brian Weeks [facility] Date of Exam: 06/10/2021 Medical Rec #:  [id number]  Height:       71.0 in Accession #:    [id number]         Weight:       218.0 lb Date of Birth:  1950-10-17          BSA:          2.187 m Patient Age:    8 years           BP:           94/63 mmHg Patient Gender: M                  HR:           127 bpm. Exam Location:  Inpatient Procedure: 3D Echo, Transesophageal Echo, Cardiac Doppler and Color Doppler Indications:     Atrial Fibrilation  History:         Patient has prior history of Echocardiogram examinations, most                  recent 06/09/2021.  Sonographer:     Philipp Deputy RDCS Referring Phys:  [id number] [facility] Diagnosing Phys: Rudean Haskell MD PROCEDURE: After discussion of the risks and benefits of a TEE, an informed consent was obtained from the patient. The transesophogeal probe was passed without difficulty through the esophogus of the patient. Imaged were obtained with the patient in a left lateral decubitus position. Sedation performed by different physician. The patient was monitored while under deep sedation. Anesthestetic sedation was provided intravenously by Anesthesiology: 203mg  of Propofol. Image quality was adequate. The patient developed no complications during the procedure. IMPRESSIONS  1. There is linear echodensity in the left atrial appendage with heavy smoke and stalk attachment. Contrast study done; though not classical appearnce in the clinical syndrome of atrial fibrillation did not proceed to cardioversion.  2. Left ventricular  ejection fraction, by estimation, is 20 to 25%. The left ventricle has severely decreased function. The left ventricle demonstrates global hypokinesis.  3. Right ventricular systolic function is normal. The right ventricular size is normal.  4. Left atrial size was moderately dilated. A left atrial/left atrial appendage thrombus was detected.  5. The mitral valve is normal in structure. Mild to moderate mitral valve regurgitation. No evidence of mitral stenosis.  6. The aortic valve is tricuspid. Aortic valve regurgitation is not visualized. FINDINGS  Left Ventricle: Left ventricular ejection fraction, by estimation, is 20 to 25%. The left ventricle has severely decreased function. The left ventricle demonstrates global hypokinesis. Definity contrast agent was given IV to delineate the left ventricular endocardial borders. The left ventricular internal cavity size was normal in size. Right Ventricle: The right ventricular size is normal. No increase in right ventricular wall thickness. Right ventricular systolic function is normal. Left Atrium: Left atrial size was moderately dilated. A left atrial/left atrial appendage thrombus was detected. Right Atrium: Right atrial size was normal in size. Pericardium: There is no evidence of pericardial effusion. Mitral Valve: The mitral valve is normal in structure. Mild to moderate mitral valve regurgitation. No evidence of mitral valve stenosis. Tricuspid Valve: The tricuspid valve is normal in structure. Tricuspid valve regurgitation is mild. Aortic Valve: The aortic valve is tricuspid. Aortic valve regurgitation is not visualized. Pulmonic Valve: The pulmonic valve was normal in structure. Pulmonic valve regurgitation is not visualized. Aorta: The aortic root and ascending aorta are structurally normal, with no evidence of dilitation. IAS/Shunts: The atrial septum is grossly normal. Rudean Haskell MD Electronically signed by  Rudean Haskell MD Signature  Date/Time: 06/10/2021/11:54:59 AM    Final     Cardiac Studies   TEE: 1. There is linear echodensity in the left atrial appendage with heavy  smoke and stalk attachment. Contrast study done; though not classical  appearnce in the clinical syndrome of atrial fibrillation did not proceed  to cardioversion.   2. Left ventricular ejection fraction, by estimation, is 20 to 25%. The  left ventricle has severely decreased function. The left ventricle  demonstrates global hypokinesis.   3. Right ventricular systolic function is normal. The right ventricular  size is normal.   4. Left atrial size was moderately dilated. A left atrial/left atrial  appendage thrombus was detected.   5. The mitral valve is normal in structure. Mild to moderate mitral valve  regurgitation. No evidence of mitral stenosis.   6. The aortic valve is tricuspid. Aortic valve regurgitation is not  visualized.   Patient Profile     70 y.o. male with new onset atrial fibrillation of unknown duration, severe LV dysfunction, and acute systolic heart failure  Assessment & Plan    Atrial fibrillation with RVR, unknown duration: no DCCV because of heavy smoke and possible LAA thrombus on TEE. Limited to rate-control rx until he completes 3 weeks of oral anticoagulation.  Add digoxin today.  Add low-dose metoprolol succinate.  If heart rate better controlled tomorrow morning, convert IV amiodarone to oral.  Continue apixaban for anticoagulation. 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  Patient was initially diuresed with good response.  Creatinine is mildly increased from arrival.  Hold diuretic therapy.  Plan repeat echocardiogram in about 3 months to reassess LV function after restoration of sinus rhythm.  As above, add digoxin and low-dose beta-blocker today. CKD 3: Creatinine 1.88 today, increased from 1.53 on arrival.  Hold diuretic therapy.  Extremities are warm, making good urine.  Follow  labs.  Disposition: Add digoxin and low-dose beta-blocker today.  Hopefully we will be able to transition from IV to oral amiodarone tomorrow.  Follow creatinine.  Begin to mobilize once heart rate better controlled.  Asked him to be up in the chair today.    For questions or updates, please contact [place] Please consult [email or web address] for contact info under     Signed, Sherren Mocha, MD  06/11/2021, 6:24 AM

## 2021-06-11 NOTE — Evaluation (Signed)
Physical Therapy Evaluation Patient Details Name: Brian Weeks MRN: [id number] DOB: 1951/07/16 Today's Date: 06/11/2021  History of Present Illness  70 y.o. male with a PMH significant for gout, HLD.  He presented with exertional shortness of breath several weeks, he was found that he had A. fib RVR.  Clinical Impression  Pt admitted with/for Afib/RVR.  Pt generally supervision to mod I level for mobility.  Pt currently limited functionally due to the problems listed below.  (see problems list.)  Pt will benefit from PT to maximize function and safety to be able to get home safely with available assist.        Recommendations for follow up therapy are one component of a multi-disciplinary discharge planning process, led by the attending physician.  Recommendations may be updated based on patient status, additional functional criteria and insurance authorization.  Follow Up Recommendations No PT follow up    Assistance Recommended at Discharge PRN  Functional Status Assessment Patient has had a recent decline in their functional status and demonstrates the ability to make significant improvements in function in a reasonable and predictable amount of time.  Equipment Recommendations  None recommended by PT    Recommendations for Other Services       Precautions / Restrictions Precautions Precautions: Other (comment) Precaution Comments: Watch HR and O2 sats Restrictions Weight Bearing Restrictions: No      Mobility  Bed Mobility Overal bed mobility: Modified Independent             General bed mobility comments: OOB on arrival    Transfers Overall transfer level: Modified independent                      Ambulation/Gait Ambulation/Gait assistance: Supervision Gait Distance (Feet): 250 Feet Assistive device: None Gait Pattern/deviations: Step-through pattern   Gait velocity interpretation: 1.31 - 2.62 ft/sec, indicative of limited community  ambulator General Gait Details: guarded and stiff, but generally steady.  Stairs            Wheelchair Mobility    Modified Rankin (Stroke Patients Only)       Balance Overall balance assessment: Mild deficits observed, not formally tested                                           Pertinent Vitals/Pain Pain Assessment: 0-10 Pain Score: 8  Pain Location: left arm Pain Descriptors / Indicators: Sore    Home Living Family/patient expects to be discharged to:: Private residence Living Arrangements: Spouse/significant other Available Help at Discharge: Family;Available 24 hours/day Type of Home: House Home Access: Level entry       Home Layout: One level Home Equipment: Shower seat      Prior Function Prior Level of Function : Independent/Modified Independent             Mobility Comments: Walks without an AD, drives. ADLs Comments: No assist with ADL and IADL.  Helps spouse with home management.     Hand Dominance   Dominant Hand: Right    Extremity/Trunk Assessment   Upper Extremity Assessment Upper Extremity Assessment: Overall WFL for tasks assessed    Lower Extremity Assessment Lower Extremity Assessment: Overall WFL for tasks assessed    Cervical / Trunk Assessment Cervical / Trunk Assessment: Normal  Communication   Communication: No difficulties  Cognition Arousal/Alertness: Awake/alert Behavior During Therapy: WFL for  tasks assessed/performed Overall Cognitive Status: Within Functional Limits for tasks assessed                                          General Comments General comments (skin integrity, edema, etc.): HR  116 to 131 bpm, during gait, but not based on changes in intensity.    Exercises     Assessment/Plan    PT Assessment Patient needs continued PT services  PT Problem List Decreased activity tolerance;Decreased mobility;Decreased strength       PT Treatment Interventions       PT Goals (Current goals can be found in the Care Plan section)  Acute Rehab PT Goals Patient Stated Goal: back to plof PT Goal Formulation: With patient Time For Goal Achievement: 06/18/21 Potential to Achieve Goals: Good    Frequency Min 3X/week   Barriers to discharge        Co-evaluation               AM-PAC PT "6 Clicks" Mobility  Outcome Measure Help needed turning from your back to your side while in a flat bed without using bedrails?: None Help needed moving from lying on your back to sitting on the side of a flat bed without using bedrails?: None Help needed moving to and from a bed to a chair (including a wheelchair)?: None Help needed standing up from a chair using your arms (e.g., wheelchair or bedside chair)?: None Help needed to walk in hospital room?: A Little Help needed climbing 3-5 steps with a railing? : A Little 6 Click Score: 22    End of Session   Activity Tolerance: Patient tolerated treatment well Patient left: in chair;with call bell/phone within reach Nurse Communication: Mobility status PT Visit Diagnosis: Other abnormalities of gait and mobility (R26.89)    Time: 1219-7588 PT Time Calculation (min) (ACUTE ONLY): 33 min   Charges:   PT Evaluation $PT Eval Low Complexity: 1 Low PT Treatments $Gait Training: 8-22 mins        06/11/2021  Ginger Carne., PT Acute Rehabilitation Services [phone]  (pager) [phone]  (office)  [name] 06/11/2021, 2:17 PM

## 2021-06-11 NOTE — Progress Notes (Signed)
PROGRESS NOTE    Brian Weeks  QAS:[id number] DOB: 1950/08/22 DOA: 06/08/2021 PCP: Brian Loader, FNP   Brief Narrative:  Brian Weeks is a 70 y.o. male with a PMH significant for gout, HLD. He presented from home to the ED on 06/08/2021 with exertional shortness of breath several weeks in the ED, it was found that he had A. fib RVR.  Patient was treated with Cardizem drip and Eliquis.  Patient was admitted to medicine service for further workup and management of new onset A. fib as outlined in detail below.  Assessment & Plan:   Principal Problem:   Atrial fibrillation with RVR (HCC) Active Problems:   Acute CHF (congestive heart failure) (HCC)   AKI (acute kidney injury) (HCC)   Prolonged QT interval  New onset A. fib with RVR: Echo shows 20 to 25% ejection fraction with left ventricular global hypokinesis, remains on amiodarone drip but rates around 120.  Plan was to do TEE cardioversion however reportedly he was found to have possible clot in left atrial appendage so DCCV was not pursued.  Cardiology has added digoxin and metoprolol today to see if his rates are under control, if so, plan to switch to oral amiodarone tomorrow. Continue current management including Eliquis.  Further management per cardiology.    Acute systolic congestive heart failure: Echo results as above.  Received IV Lasix.  No shortness of breath, no crackles on exam.  Defer to cardiology for management.   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  His creatinine has remained stable around 1.6 since admission.  No prior results available in the chart for [place] to compare.  I do not think he has AKI.  Hypokalemia: Resolved.   DVT prophylaxis:    Code Status: Full Code  Family Communication: None present at bedside.  Plan of care discussed with patient in length and he verbalized understanding and agreed with it.  I answered several questions he had for cardiology but he could not remember when he  was seen by cardiologist.  Status is: Inpatient  Remains inpatient appropriate because: Needs further management.  Estimated body mass index is 30.4 kg/m as calculated from the following:   Height as of this encounter: 5\' 11"  (1.803 m).   Weight as of this encounter: 98.9 kg.     Nutritional Assessment: Body mass index is 30.4 kg/m.Marland Kitchen Seen by dietician.  I agree with the assessment and plan as outlined below: Nutrition Status:  Skin Assessment: I have examined the patient's skin and I agree with the wound assessment as performed by the wound care RN as outlined below:    Consultants:  Cardiology  Procedures:  TEE  Antimicrobials:  Anti-infectives (From admission, onward)    None          Subjective: Patient seen and examined.  Feels weak.  Otherwise no shortness of breath, chest pain or palpitation.  We will consult PT OT to see him.  Objective: Vitals:   06/10/21 2304 06/11/21 0302 06/11/21 0724 06/11/21 1000  BP: 104/88 113/85 (!) 121/96 110/74  Pulse: (!) 120 (!) 120 (!) 123 (!) 125  Resp: 20 20    Temp: 98.2 F (36.8 C) 98.4 F (36.9 C) 98.1 F (36.7 C)   TempSrc: Oral Oral Oral   SpO2: 94% 97% 99%   Weight:      Height:        Intake/Output Summary (Last 24 hours) at 06/11/2021 1131 Last data filed at 06/11/2021 1006  Gross per 24 hour  Intake 540.54 ml  Output 1100 ml  Net -559.46 ml    Filed Weights   06/08/21 2028 06/09/21 2144 06/10/21 0902  Weight: 103 kg 99.1 kg 98.9 kg    Examination:  General exam: Appears calm and comfortable  Respiratory system: Clear to auscultation. Respiratory effort normal. Cardiovascular system: S1 & S2 heard, irregularly irregular rate and rhythm, no JVD, murmurs, rubs, gallops or clicks. No pedal edema. Gastrointestinal system: Abdomen is nondistended, soft and nontender. No organomegaly or masses felt. Normal bowel sounds heard. Central nervous system: Alert and oriented. No focal neurological  deficits. Extremities: Symmetric 5 x 5 power. Skin: No rashes, lesions or ulcers.  Psychiatry: Judgement and insight appear normal. Mood & affect appropriate.    Data Reviewed: I have personally reviewed following labs and imaging studies  CBC: Recent Labs  Lab 06/08/21 2045 06/09/21 0152  WBC 7.4 7.6  NEUTROABS 5.0  --   HGB 12.1* 13.0  HCT 39.9 42.7  MCV 88.1 87.7  PLT 205 144    Basic Metabolic Panel: Recent Labs  Lab 06/08/21 2045 06/09/21 0152 06/10/21 0832 06/11/21 0040  NA 140 140 139 136  K 4.4 4.0 3.3* 4.3  CL 109 105 104 103  CO2 21* 24 26 24   GLUCOSE 107* 146* 102* 102*  BUN 29* 27* 27* 30*  CREATININE 1.53* 1.65* 1.82* 1.88*  CALCIUM 8.8* 9.2 8.7* 8.6*  MG 2.1  --  2.0  --     GFR: Estimated Creatinine Clearance: 43.8 mL/min (A) (by C-G formula based on SCr of 1.88 mg/dL (H)). Liver Function Tests: Recent Labs  Lab 06/08/21 2045  AST 21  ALT 18  ALKPHOS 110  BILITOT 1.1  PROT 7.2  ALBUMIN 3.1*    No results for input(s): LIPASE, AMYLASE in the last 168 hours. No results for input(s): AMMONIA in the last 168 hours. Coagulation Profile: No results for input(s): INR, PROTIME in the last 168 hours. Cardiac Enzymes: No results for input(s): CKTOTAL, CKMB, CKMBINDEX, TROPONINI in the last 168 hours. BNP (last 3 results) No results for input(s): PROBNP in the last 8760 hours. HbA1C: Recent Labs    06/11/21 0040  HGBA1C 6.1*   CBG: No results for input(s): GLUCAP in the last 168 hours. Lipid Profile: Recent Labs    06/09/21 0152  CHOL 111  HDL 32*  LDLCALC 71  TRIG 40  CHOLHDL 3.5    Thyroid Function Tests: Recent Labs    06/08/21 2045  TSH 3.281    Anemia Panel: No results for input(s): VITAMINB12, FOLATE, FERRITIN, TIBC, IRON, RETICCTPCT in the last 72 hours. Sepsis Labs: No results for input(s): PROCALCITON, LATICACIDVEN in the last 168 hours.  Recent Results (from the past 240 hour(s))  Urine Culture     Status: None    Collection Time: 06/08/21  7:37 PM   Specimen: Urine, Clean Catch  Result Value Ref Range Status   Specimen Description URINE, CLEAN CATCH  Final   Special Requests NONE  Final   Culture   Final    NO GROWTH Performed at [facility], [address] [place], [place] [address]    Report Status 06/09/2021 FINAL  Final  Resp Panel by RT-PCR (Flu A&B, Covid) Nasopharyngeal Swab     Status: None   Collection Time: 06/08/21  8:36 PM   Specimen: Nasopharyngeal Swab; Nasopharyngeal(NP) swabs in vial transport medium  Result Value Ref Range Status   SARS Coronavirus 2 by RT  PCR NEGATIVE NEGATIVE Final    Comment: (NOTE) SARS-CoV-2 target nucleic acids are NOT DETECTED.  The SARS-CoV-2 RNA is generally detectable in upper respiratory specimens during the acute phase of infection. The lowest concentration of SARS-CoV-2 viral copies this assay can detect is 138 copies/mL. A negative result does not preclude SARS-Cov-2 infection and should not be used as the sole basis for treatment or other patient management decisions. A negative result may occur with  improper specimen collection/handling, submission of specimen other than nasopharyngeal swab, presence of viral mutation(s) within the areas targeted by this assay, and inadequate number of viral copies(<138 copies/mL). A negative result must be combined with clinical observations, patient history, and epidemiological information. The expected result is Negative.  Fact Sheet for Patients:  EntrepreneurPulse.com.au  Fact Sheet for Healthcare Providers:  IncredibleEmployment.be  This test is no t yet approved or cleared by the [place] FDA and  has been authorized for detection and/or diagnosis of SARS-CoV-2 by FDA under an Emergency Use Authorization (EUA). This EUA will remain  in effect (meaning this test can be used) for the duration of the COVID-19 declaration under Section 564(b)(1) of  the Act, 21 U.S.C.section 360bbb-3(b)(1), unless the authorization is terminated  or revoked sooner.       Influenza A by PCR NEGATIVE NEGATIVE Final   Influenza B by PCR NEGATIVE NEGATIVE Final    Comment: (NOTE) The Xpert Xpress SARS-CoV-2/FLU/RSV plus assay is intended as an aid in the diagnosis of influenza from Nasopharyngeal swab specimens and should not be used as a sole basis for treatment. Nasal washings and aspirates are unacceptable for Xpert Xpress SARS-CoV-2/FLU/RSV testing.  Fact Sheet for Patients: EntrepreneurPulse.com.au  Fact Sheet for Healthcare Providers: IncredibleEmployment.be  This test is not yet approved or cleared by the [place] FDA and has been authorized for detection and/or diagnosis of SARS-CoV-2 by FDA under an Emergency Use Authorization (EUA). This EUA will remain in effect (meaning this test can be used) for the duration of the COVID-19 declaration under Section 564(b)(1) of the Act, 21 U.S.C. section 360bbb-3(b)(1), unless the authorization is terminated or revoked.  Performed at [facility], [place] [address], [place], [place] [address]   MRSA Next Gen by PCR, Nasal     Status: None   Collection Time: 06/09/21  9:48 PM   Specimen: Nasal Mucosa; Nasal Swab  Result Value Ref Range Status   MRSA by PCR Next Gen NOT DETECTED NOT DETECTED Final    Comment: (NOTE) The GeneXpert MRSA Assay (FDA approved for NASAL specimens only), is one component of a comprehensive MRSA colonization surveillance program. It is not intended to diagnose MRSA infection nor to guide or monitor treatment for MRSA infections. Test performance is not FDA approved in patients less than 27 years old. Performed at [facility], [place] [address], [place], [place] [address]        Radiology Studies: ECHOCARDIOGRAM COMPLETE  Result Date: 06/09/2021    ECHOCARDIOGRAM REPORT   Patient Name:   Brian Weeks [facility] Date  of Exam: 06/09/2021 Medical Rec #:  [id number]          Height:       71.0 in Accession #:    [id number]         Weight:       227.0 lb Date of Birth:  [birth year]          BSA:          2.225 m Patient Age:  70 years           BP:           118/100 mmHg Patient Gender: M                  HR:           108 bpm. Exam Location:  Inpatient Procedure: 2D Echo, Cardiac Doppler, Color Doppler and Intracardiac            Opacification Agent Indications:    Atrial Fibrillation  History:        Patient has no prior history of Echocardiogram examinations.                 Arrythmias:Atrial Fibrillation and Tachycardia.  Sonographer:    Merrie Roof RDCS Referring Phys: [id number] [place]  1. Left ventricular ejection fraction, by estimation, is 20 to 25%. The left ventricle has severely decreased function. The left ventricle demonstrates global hypokinesis. The left ventricular internal cavity size was mildly dilated. Left ventricular diastolic function could not be evaluated.  2. Right ventricular systolic function is moderately reduced. The right ventricular size is normal. There is moderately elevated pulmonary artery systolic pressure. The estimated right ventricular systolic pressure is 81.0 mmHg.  3. Left atrial size was mildly dilated.  4. Right atrial size was mildly dilated.  5. The mitral valve is grossly normal. Mild to moderate mitral valve regurgitation. No evidence of mitral stenosis.  6. Tricuspid valve regurgitation is moderate.  7. The aortic valve is tricuspid. Aortic valve regurgitation is not visualized. No aortic stenosis is present.  8. The inferior vena cava is dilated in size with <50% respiratory variability, suggesting right atrial pressure of 15 mmHg. FINDINGS  Left Ventricle: Left ventricular ejection fraction, by estimation, is 20 to 25%. The left ventricle has severely decreased function. The left ventricle demonstrates global hypokinesis. Definity contrast agent was given IV to  delineate the left ventricular endocardial borders. The left ventricular internal cavity size was mildly dilated. There is no left ventricular hypertrophy. Left ventricular diastolic function could not be evaluated due to atrial fibrillation. Left ventricular diastolic function could not be evaluated. Right Ventricle: The right ventricular size is normal. No increase in right ventricular wall thickness. Right ventricular systolic function is moderately reduced. There is moderately elevated pulmonary artery systolic pressure. The tricuspid regurgitant velocity is 2.86 m/s, and with an assumed right atrial pressure of 15 mmHg, the estimated right ventricular systolic pressure is 17.5 mmHg. Left Atrium: Left atrial size was mildly dilated. Right Atrium: Right atrial size was mildly dilated. Pericardium: There is no evidence of pericardial effusion. Mitral Valve: The mitral valve is grossly normal. Mild to moderate mitral valve regurgitation. No evidence of mitral valve stenosis. Tricuspid Valve: The tricuspid valve is grossly normal. Tricuspid valve regurgitation is moderate . No evidence of tricuspid stenosis. Aortic Valve: The aortic valve is tricuspid. Aortic valve regurgitation is not visualized. No aortic stenosis is present. Aortic valve mean gradient measures 2.0 mmHg. Aortic valve peak gradient measures 3.7 mmHg. Aortic valve area, by VTI measures 1.48 cm. Pulmonic Valve: The pulmonic valve was grossly normal. Pulmonic valve regurgitation is not visualized. No evidence of pulmonic stenosis. Aorta: The aortic root and ascending aorta are structurally normal, with no evidence of dilitation. Venous: The inferior vena cava is dilated in size with less than 50% respiratory variability, suggesting right atrial pressure of 15 mmHg. IAS/Shunts: The atrial septum is grossly normal.  LEFT VENTRICLE PLAX 2D  LVIDd:         5.80 cm LVIDs:         5.20 cm LV PW:         0.97 cm LV IVS:        0.83 cm LVOT diam:     2.10 cm  LV SV:         25 LV SV Index:   11 LVOT Area:     3.46 cm  RIGHT VENTRICLE             IVC RV Basal diam:  3.80 cm     IVC diam: 2.40 cm RV S prime:     12.70 cm/s TAPSE (M-mode): 1.6 cm LEFT ATRIUM              Index        RIGHT ATRIUM           Index LA diam:        4.70 cm  2.11 cm/m   RA Area:     29.10 cm LA Vol (A2C):   105.0 ml 47.19 ml/m  RA Volume:   114.00 ml 51.23 ml/m LA Vol (A4C):   64.4 ml  28.94 ml/m LA Biplane Vol: 81.8 ml  36.76 ml/m  AORTIC VALVE AV Area (Vmax):    2.03 cm AV Area (Vmean):   1.79 cm AV Area (VTI):     1.48 cm AV Vmax:           96.50 cm/s AV Vmean:          73.000 cm/s AV VTI:            0.172 m AV Peak Grad:      3.7 mmHg AV Mean Grad:      2.0 mmHg LVOT Vmax:         56.60 cm/s LVOT Vmean:        37.700 cm/s LVOT VTI:          0.074 m LVOT/AV VTI ratio: 0.43  AORTA Ao Root diam: 3.05 cm Ao Asc diam:  3.10 cm TRICUSPID VALVE TR Peak grad:   32.7 mmHg TR Vmax:        286.00 cm/s  SHUNTS Systemic VTI:  0.07 m Systemic Diam: 2.10 cm Eleonore Chiquito MD Electronically signed by Eleonore Chiquito MD Signature Date/Time: 06/09/2021/1:24:43 PM    Final    ECHO TEE  Result Date: 06/10/2021    TRANSESOPHOGEAL ECHO REPORT   Patient Name:   Brian Weeks [facility] Date of Exam: 06/10/2021 Medical Rec #:  [id number]          Height:       71.0 in Accession #:    [id number]         Weight:       218.0 lb Date of Birth:  [birth year]          BSA:          2.187 m Patient Age:    34 years           BP:           94/63 mmHg Patient Gender: M                  HR:           127 bpm. Exam Location:  Inpatient Procedure: 3D Echo, Transesophageal Echo, Cardiac Doppler and Color Doppler Indications:     Atrial Fibrilation  History:  Patient has prior history of Echocardiogram examinations, most                  recent 06/09/2021.  Sonographer:     Philipp Deputy RDCS Referring Phys:  [id number] [facility] [place] Diagnosing Phys: Rudean Haskell MD PROCEDURE: After discussion of the risks  and benefits of a TEE, an informed consent was obtained from the patient. The transesophogeal probe was passed without difficulty through the esophogus of the patient. Imaged were obtained with the patient in a left lateral decubitus position. Sedation performed by different physician. The patient was monitored while under deep sedation. Anesthestetic sedation was provided intravenously by Anesthesiology: 203mg  of Propofol. Image quality was adequate. The patient developed no complications during the procedure. IMPRESSIONS  1. There is linear echodensity in the left atrial appendage with heavy smoke and stalk attachment. Contrast study done; though not classical appearnce in the clinical syndrome of atrial fibrillation did not proceed to cardioversion.  2. Left ventricular ejection fraction, by estimation, is 20 to 25%. The left ventricle has severely decreased function. The left ventricle demonstrates global hypokinesis.  3. Right ventricular systolic function is normal. The right ventricular size is normal.  4. Left atrial size was moderately dilated. A left atrial/left atrial appendage thrombus was detected.  5. The mitral valve is normal in structure. Mild to moderate mitral valve regurgitation. No evidence of mitral stenosis.  6. The aortic valve is tricuspid. Aortic valve regurgitation is not visualized. FINDINGS  Left Ventricle: Left ventricular ejection fraction, by estimation, is 20 to 25%. The left ventricle has severely decreased function. The left ventricle demonstrates global hypokinesis. Definity contrast agent was given IV to delineate the left ventricular endocardial borders. The left ventricular internal cavity size was normal in size. Right Ventricle: The right ventricular size is normal. No increase in right ventricular wall thickness. Right ventricular systolic function is normal. Left Atrium: Left atrial size was moderately dilated. A left atrial/left atrial appendage thrombus was detected.  Right Atrium: Right atrial size was normal in size. Pericardium: There is no evidence of pericardial effusion. Mitral Valve: The mitral valve is normal in structure. Mild to moderate mitral valve regurgitation. No evidence of mitral valve stenosis. Tricuspid Valve: The tricuspid valve is normal in structure. Tricuspid valve regurgitation is mild. Aortic Valve: The aortic valve is tricuspid. Aortic valve regurgitation is not visualized. Pulmonic Valve: The pulmonic valve was normal in structure. Pulmonic valve regurgitation is not visualized. Aorta: The aortic root and ascending aorta are structurally normal, with no evidence of dilitation. IAS/Shunts: The atrial septum is grossly normal. Rudean Haskell MD Electronically signed by Rudean Haskell MD Signature Date/Time: 06/10/2021/11:54:59 AM    Final     Scheduled Meds:  apixaban  5 mg Oral BID   digoxin  0.125 mg Oral Daily   influenza vaccine adjuvanted  0.5 mL Intramuscular Tomorrow-1000   metoprolol succinate  25 mg Oral Daily   sodium chloride flush  3 mL Intravenous Q12H   Continuous Infusions:  sodium chloride     amiodarone 30 mg/hr (06/11/21 0443)     LOS: 3 days   Time spent: 30 minutes   Darliss Cheney, MD [facility]  06/11/2021, 11:31 AM  Please page via [facility] and do not message via secure chat for anything urgent. Secure chat can be used for anything non urgent.  How to contact the [facility] Attending or Consulting provider [place] or covering provider during after hours [place], for this patient?  Check  the care team in [facility] and look for a) attending/consulting [place] provider listed and b) the [facility] team listed. Page or secure chat [phone]. Log into [email or web address] and use [place]'s universal password to access. If you do not have the password, please contact the hospital operator. Locate the [facility] provider you are looking for under [facility] and page to a number that you can be directly reached. If you still  have difficulty reaching the provider, please page the [facility] (Director on Call) for the Hospitalists listed on amion for assistance.

## 2021-06-11 NOTE — Progress Notes (Signed)
Mobility Specialist Progress Note:   06/11/21 1533  Mobility  Activity Transferred:  Chair to bed  Level of Assistance Minimal assist, patient does 75% or more  Assistive Device None  Distance Ambulated (ft) 4 ft  Mobility Ambulated with assistance in room  Mobility Response Tolerated well  Mobility performed by Mobility specialist  $Mobility charge 1 Mobility   Pt received in chair requesting to go back to bed. MinA to stand and then supervision to move to bed.   [facility] Health and safety inspector Phone [phone]

## 2021-06-12 DIAGNOSIS — I5021 Acute systolic (congestive) heart failure: Secondary | ICD-10-CM | POA: Diagnosis not present

## 2021-06-12 DIAGNOSIS — I4891 Unspecified atrial fibrillation: Secondary | ICD-10-CM | POA: Diagnosis not present

## 2021-06-12 LAB — BASIC METABOLIC PANEL
Anion gap: 10 (ref 5–15)
BUN: 30 mg/dL — ABNORMAL HIGH (ref 8–23)
CO2: 23 mmol/L (ref 22–32)
Calcium: 8.7 mg/dL — ABNORMAL LOW (ref 8.9–10.3)
Chloride: 98 mmol/L (ref 98–111)
Creatinine, Ser: 1.77 mg/dL — ABNORMAL HIGH (ref 0.61–1.24)
GFR, Estimated: 41 mL/min — ABNORMAL LOW (ref >60)
Glucose, Bld: 114 mg/dL — ABNORMAL HIGH (ref 70–99)
Potassium: 4.6 mmol/L (ref 3.5–5.1)
Sodium: 131 mmol/L — ABNORMAL LOW (ref 135–145)

## 2021-06-12 MED ORDER — METOPROLOL SUCCINATE ER 25 MG PO TB24: 37.5000 mg | ORAL_TABLET | Freq: Every day | ORAL | Status: DC

## 2021-06-12 MED ORDER — ALLOPURINOL 100 MG PO TABS
100.0000 mg | ORAL_TABLET | Freq: Every day | ORAL | Status: DC
  Administered 2021-06-12 – 2021-06-15 (×4): 100 mg via ORAL
  Filled 2021-06-12 (×4): qty 1

## 2021-06-12 MED ORDER — METOPROLOL SUCCINATE ER 25 MG PO TB24
12.5000 mg | ORAL_TABLET | Freq: Once | ORAL | Status: AC
  Administered 2021-06-12: 12.5 mg via ORAL
  Filled 2021-06-12: qty 1

## 2021-06-12 MED ORDER — PREDNISONE 20 MG PO TABS
40.0000 mg | ORAL_TABLET | Freq: Every day | ORAL | Status: DC
  Administered 2021-06-12 – 2021-06-15 (×4): 40 mg via ORAL
  Filled 2021-06-12 (×4): qty 2

## 2021-06-12 MED ORDER — ATORVASTATIN CALCIUM 40 MG PO TABS
40.0000 mg | ORAL_TABLET | Freq: Every day | ORAL | Status: DC
  Administered 2021-06-12 – 2021-06-15 (×4): 40 mg via ORAL
  Filled 2021-06-12 (×4): qty 1

## 2021-06-12 NOTE — Discharge Instructions (Addendum)
Heart Failure Education: Weigh yourself EVERY morning after you go to the bathroom but before you eat or drink anything. Write this number down in a weight log/diary. If you gain 3 pounds overnight or 5 pounds in a week, call the office. Take your medicines as prescribed. If you have concerns about your medications, please call us before you stop taking them.  Eat low salt foods--Limit salt (sodium) to 2000 mg per day. This will help prevent your body from holding onto fluid. Read food labels as many processed foods have a lot of sodium, especially canned goods and prepackaged meats. If you would like some assistance choosing low sodium foods, we would be happy to set you up with a nutritionist. Stay as active as you can everyday. Staying active will give you more energy and make your muscles stronger. Start with 5 minutes at a time and work your way up to 30 minutes a day. Break up your activities--do some in the morning and some in the afternoon. Start with 3 days per week and work your way up to 5 days as you can.  If you have chest pain, feel short of breath, dizzy, or lightheaded, STOP. If you don't feel better after a short rest, call 911. If you do feel better, call the office to let us know you have symptoms with exercise. Limit all fluids for the day to less than 2 liters. Fluid includes all drinks, coffee, juice, ice chips, soup, jello, and all other liquids.   Information on my medicine - ELIQUIS (apixaban)  Why was Eliquis prescribed for you? Eliquis was prescribed for you to reduce the risk of a blood clot forming that can cause a stroke if you have a medical condition called atrial fibrillation (a type of irregular heartbeat).  What do You need to know about Eliquis ? Take your Eliquis TWICE DAILY - one tablet in the morning and one tablet in the evening with or without food. If you have difficulty swallowing the tablet whole please discuss with your pharmacist how to take the  medication safely.  Take Eliquis exactly as prescribed by your doctor and DO NOT stop taking Eliquis without talking to the doctor who prescribed the medication.  Stopping may increase your risk of developing a stroke.  Refill your prescription before you run out.  After discharge, you should have regular check-up appointments with your healthcare provider that is prescribing your Eliquis.  In the future your dose may need to be changed if your kidney function or weight changes by a significant amount or as you get older.  What do you do if you miss a dose? If you miss a dose, take it as soon as you remember on the same day and resume taking twice daily.  Do not take more than one dose of ELIQUIS at the same time to make up a missed dose.  Important Safety Information A possible side effect of Eliquis is bleeding. You should call your healthcare provider right away if you experience any of the following: Bleeding from an injury or your nose that does not stop. Unusual colored urine (red or dark brown) or unusual colored stools (red or black). Unusual bruising for unknown reasons. A serious fall or if you hit your head (even if there is no bleeding).  Some medicines may interact with Eliquis and might increase your risk of bleeding or clotting while on Eliquis. To help avoid this, consult your healthcare provider or pharmacist prior to using any  new prescription or non-prescription medications, including herbals, vitamins, non-steroidal anti-inflammatory drugs (NSAIDs) and supplements.  This website has more information on Eliquis (apixaban): http://www.eliquis.com/eliquis/home

## 2021-06-12 NOTE — Progress Notes (Signed)
PROGRESS NOTE    Brian Weeks  IPJ:[id number] DOB: 1950-12-28 DOA: 06/08/2021 PCP: Kristen Loader, FNP   Brief Narrative:  Brian Weeks is a 70 y.o. male with a PMH significant for gout, HLD. He presented from home to the ED on 06/08/2021 with exertional shortness of breath several weeks in the ED, it was found that he had A. fib RVR.  Patient was treated with Cardizem drip and Eliquis.  Patient was admitted to medicine service for further workup and management of new onset A. fib as outlined in detail below.  Assessment & Plan:   Principal Problem:   Atrial fibrillation with RVR (HCC) Active Problems:   Acute CHF (congestive heart failure) (HCC)   AKI (acute kidney injury) (HCC)   Prolonged QT interval  New onset A. fib with RVR: Echo shows 20 to 25% ejection fraction with left ventricular global hypokinesis, remains on amiodarone drip but rates around 108 today.  According to Dr. Burt Knack, patient flipped into sinus rhythm at around 3 AM but back into atrial fibrillation at 6 AM.  Despite of slightly tachycardia, high heart rate, patient has no shortness of breath or chest pain.  Plan was to do TEE cardioversion however reportedly he was found to have possible clot in left atrial appendage so DCCV was not pursued.  Plan to transition from IV to oral amiodarone, increase Toprol-XL to 37.5 mg and continue current dose of digoxin.  Acute systolic congestive heart failure: Echo results as above.  Received IV Lasix 2 days ago, blood pressure low, not allowing further IV Lasix.  No shortness of breath, no crackles on exam.  Defer to cardiology for management.   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  His creatinine has remained stable around 1.6 since admission.  No prior results available in the chart for [place] to compare.  I do not think he has AKI.  Hypokalemia: Resolved.  Left wrist gout: Patient complains of having gout about 2 months ago for which she was started on  allopurinol.  He has stopped the medication before coming to the hospital.  Now he is complaining of swelling and pain in the left wrist and he believes that it is gout.  On examination, he does have small amount of swelling and some tenderness.  Unsure whether this is gout as this not the typical site for gout.  He is allergic to colchicine.  We will start him on prednisone 40 mg p.o. daily and resume allopurinol 100 mg p.o. daily and we will see if he responds.   DVT prophylaxis:    Code Status: Full Code  Family Communication: None present at bedside.  Plan of care discussed with patient in length and he verbalized understanding and agreed with it.  I answered several questions he had for cardiology but he could not remember when he was seen by cardiologist.  Status is: Inpatient  Remains inpatient appropriate because: Needs further management.  Estimated body mass index is 30.4 kg/m as calculated from the following:   Height as of this encounter: 5\' 11"  (1.803 m).   Weight as of this encounter: 98.9 kg.     Nutritional Assessment: Body mass index is 30.4 kg/m.[place] Kitchen Seen by dietician.  I agree with the assessment and plan as outlined below: Nutrition Status:  Skin Assessment: I have examined the patient's skin and I agree with the wound assessment as performed by the wound care RN as outlined below:    Consultants:  Cardiology  Procedures:  TEE  Antimicrobials:  Anti-infectives (From admission, onward)    None          Subjective: Patient seen and examined along with Dr. Burt Knack.  He has no chest pain or shortness of breath but now complains of left wrist pain.  All questions answered.  Objective: Vitals:   06/12/21 0356 06/12/21 0733 06/12/21 0921 06/12/21 1152  BP: 98/76   109/84  Pulse: 88  (!) 104 (!) 115  Resp: 17 15  14   Temp: 97.9 F (36.6 C) 98.5 F (36.9 C)  98.2 F (36.8 C)  TempSrc: Oral Oral  Oral  SpO2: 95%   92%  Weight:      Height:         Intake/Output Summary (Last 24 hours) at 06/12/2021 1306 Last data filed at 06/12/2021 1200 Gross per 24 hour  Intake 83.35 ml  Output 275 ml  Net -191.65 ml    Filed Weights   06/08/21 2028 06/09/21 2144 06/10/21 0902  Weight: 103 kg 99.1 kg 98.9 kg    Examination:  General exam: Appears calm and comfortable  Respiratory system: Clear to auscultation. Respiratory effort normal. Cardiovascular system: S1 & S2 heard, irregularly irregular rate and rhythm. No JVD, murmurs, rubs, gallops or clicks. No pedal edema. Gastrointestinal system: Abdomen is nondistended, soft and nontender. No organomegaly or masses felt. Normal bowel sounds heard. Central nervous system: Alert and oriented. No focal neurological deficits. Extremities: Symmetric 5 x 5 power.  Mild edema and tenderness at the left wrist. Skin: No rashes, lesions or ulcers.  Psychiatry: Judgement and insight appear normal. Mood & affect appropriate.    Data Reviewed: I have personally reviewed following labs and imaging studies  CBC: Recent Labs  Lab 06/08/21 2045 06/09/21 0152  WBC 7.4 7.6  NEUTROABS 5.0  --   HGB 12.1* 13.0  HCT 39.9 42.7  MCV 88.1 87.7  PLT 205 563    Basic Metabolic Panel: Recent Labs  Lab 06/08/21 2045 06/09/21 0152 06/10/21 0832 06/11/21 0040 06/12/21 0024  NA 140 140 139 136 131*  K 4.4 4.0 3.3* 4.3 4.6  CL 109 105 104 103 98  CO2 21* 24 26 24 23   GLUCOSE 107* 146* 102* 102* 114*  BUN 29* 27* 27* 30* 30*  CREATININE 1.53* 1.65* 1.82* 1.88* 1.77*  CALCIUM 8.8* 9.2 8.7* 8.6* 8.7*  MG 2.1  --  2.0  --   --     GFR: Estimated Creatinine Clearance: 46.5 mL/min (A) (by C-G formula based on SCr of 1.77 mg/dL (H)). Liver Function Tests: Recent Labs  Lab 06/08/21 2045  AST 21  ALT 18  ALKPHOS 110  BILITOT 1.1  PROT 7.2  ALBUMIN 3.1*    No results for input(s): LIPASE, AMYLASE in the last 168 hours. No results for input(s): AMMONIA in the last 168 hours. Coagulation  Profile: No results for input(s): INR, PROTIME in the last 168 hours. Cardiac Enzymes: No results for input(s): CKTOTAL, CKMB, CKMBINDEX, TROPONINI in the last 168 hours. BNP (last 3 results) No results for input(s): PROBNP in the last 8760 hours. HbA1C: Recent Labs    06/11/21 0040  HGBA1C 6.1*    CBG: No results for input(s): GLUCAP in the last 168 hours. Lipid Profile: No results for input(s): CHOL, HDL, LDLCALC, TRIG, CHOLHDL, LDLDIRECT in the last 72 hours.  Thyroid Function Tests: No results for input(s): TSH, T4TOTAL, FREET4, T3FREE, THYROIDAB in the last 72 hours.  Anemia Panel: No results for input(s):  VITAMINB12, FOLATE, FERRITIN, TIBC, IRON, RETICCTPCT in the last 72 hours. Sepsis Labs: No results for input(s): PROCALCITON, LATICACIDVEN in the last 168 hours.  Recent Results (from the past 240 hour(s))  Urine Culture     Status: None   Collection Time: 06/08/21  7:37 PM   Specimen: Urine, Clean Catch  Result Value Ref Range Status   Specimen Description URINE, CLEAN CATCH  Final   Special Requests NONE  Final   Culture   Final    NO GROWTH Performed at [facility], [address] [place], [place] [address]    Report Status 06/09/2021 FINAL  Final  Resp Panel by RT-PCR (Flu A&B, Covid) Nasopharyngeal Swab     Status: None   Collection Time: 06/08/21  8:36 PM   Specimen: Nasopharyngeal Swab; Nasopharyngeal(NP) swabs in vial transport medium  Result Value Ref Range Status   SARS Coronavirus 2 by RT PCR NEGATIVE NEGATIVE Final    Comment: (NOTE) SARS-CoV-2 target nucleic acids are NOT DETECTED.  The SARS-CoV-2 RNA is generally detectable in upper respiratory specimens during the acute phase of infection. The lowest concentration of SARS-CoV-2 viral copies this assay can detect is 138 copies/mL. A negative result does not preclude SARS-Cov-2 infection and should not be used as the sole basis for treatment or other patient management decisions. A  negative result may occur with  improper specimen collection/handling, submission of specimen other than nasopharyngeal swab, presence of viral mutation(s) within the areas targeted by this assay, and inadequate number of viral copies(<138 copies/mL). A negative result must be combined with clinical observations, patient history, and epidemiological information. The expected result is Negative.  Fact Sheet for Patients:  EntrepreneurPulse.com.au  Fact Sheet for Healthcare Providers:  IncredibleEmployment.be  This test is no t yet approved or cleared by the [place] FDA and  has been authorized for detection and/or diagnosis of SARS-CoV-2 by FDA under an Emergency Use Authorization (EUA). This EUA will remain  in effect (meaning this test can be used) for the duration of the COVID-19 declaration under Section 564(b)(1) of the Act, 21 U.S.C.section 360bbb-3(b)(1), unless the authorization is terminated  or revoked sooner.       Influenza A by PCR NEGATIVE NEGATIVE Final   Influenza B by PCR NEGATIVE NEGATIVE Final    Comment: (NOTE) The Xpert Xpress SARS-CoV-2/FLU/RSV plus assay is intended as an aid in the diagnosis of influenza from Nasopharyngeal swab specimens and should not be used as a sole basis for treatment. Nasal washings and aspirates are unacceptable for Xpert Xpress SARS-CoV-2/FLU/RSV testing.  Fact Sheet for Patients: EntrepreneurPulse.com.au  Fact Sheet for Healthcare Providers: IncredibleEmployment.be  This test is not yet approved or cleared by the [place] FDA and has been authorized for detection and/or diagnosis of SARS-CoV-2 by FDA under an Emergency Use Authorization (EUA). This EUA will remain in effect (meaning this test can be used) for the duration of the COVID-19 declaration under Section 564(b)(1) of the Act, 21 U.S.C. section 360bbb-3(b)(1), unless the authorization  is terminated or revoked.  Performed at [facility], [place] [address] [place], [place] [address]   MRSA Next Gen by PCR, Nasal     Status: None   Collection Time: 06/09/21  9:48 PM   Specimen: Nasal Mucosa; Nasal Swab  Result Value Ref Range Status   MRSA by PCR Next Gen NOT DETECTED NOT DETECTED Final    Comment: (NOTE) The GeneXpert MRSA Assay (FDA approved for NASAL specimens only), is one  component of a comprehensive MRSA colonization surveillance program. It is not intended to diagnose MRSA infection nor to guide or monitor treatment for MRSA infections. Test performance is not FDA approved in patients less than 42 years old. Performed at [facility], [place] [address], [place], [place] [address]        Radiology Studies: No results found.  Scheduled Meds:  allopurinol  100 mg Oral Daily   apixaban  5 mg Oral BID   atorvastatin  40 mg Oral Daily   digoxin  0.125 mg Oral Daily   influenza vaccine adjuvanted  0.5 mL Intramuscular Tomorrow-1000   [START ON 06/13/2021] metoprolol succinate  37.5 mg Oral Daily   predniSONE  40 mg Oral Q breakfast   sodium chloride flush  3 mL Intravenous Q12H   Continuous Infusions:  sodium chloride     amiodarone 30 mg/hr (06/12/21 1200)     LOS: 4 days   Time spent: 28 minutes   Darliss Cheney, MD [facility]  06/12/2021, 1:06 PM  Please page via [place] and do not message via secure chat for anything urgent. Secure chat can be used for anything non urgent.  How to contact the [facility] Attending or Consulting provider [place] or covering provider during after hours [place], for this patient?  Check the care team in [facility] and look for a) attending/consulting TR[facility] provider listed and b) the [facility] team listed. Page or secure chat [phone]. Log into [email or web address] and use [place]'s universal password to access. If you do not have the password, please contact the hospital operator. Locate the [facility] provider you are looking for  under [facility] and page to a number that you can be directly reached. If you still have difficulty reaching the provider, please page the [facility] (Director on Call) for the Hospitalists listed on amion for assistance.

## 2021-06-12 NOTE — Progress Notes (Signed)
Ice pack to right anterior forearm post iv site  applied for 20 min.  due to swelling.  Swelling subsided. Continue to monitor.

## 2021-06-12 NOTE — Progress Notes (Signed)
Progress Note  Patient Name: Brian Weeks Date of Encounter: 06/12/2021  [facility] Cardiologist: None   Subjective   Feeling okay today.  No chest pain or shortness of breath.  He was up in a chair yesterday and tolerated this well.  Telemetry is reviewed and he actually converted to sinus rhythm overnight for about 3 hours but went back into atrial fibrillation at 630 this morning.  The patient is complaining of left wrist pain that he attributes to gout.  Inpatient Medications    Scheduled Meds:  apixaban  5 mg Oral BID   digoxin  0.125 mg Oral Daily   influenza vaccine adjuvanted  0.5 mL Intramuscular Tomorrow-1000   metoprolol succinate  25 mg Oral Daily   sodium chloride flush  3 mL Intravenous Q12H   Continuous Infusions:  sodium chloride     amiodarone 30 mg/hr (06/12/21 0309)   PRN Meds: sodium chloride, acetaminophen **OR** acetaminophen, sodium chloride flush   Vital Signs    Vitals:   06/11/21 1728 06/11/21 1949 06/11/21 2311 06/12/21 0356  BP: 98/68 96/82 97/83  98/76  Pulse: (!) 101 (!) 109 (!) 106 88  Resp:  18 20 17   Temp: (!) 97.4 F (36.3 C) 98 F (36.7 C) 98.6 F (37 C) 97.9 F (36.6 C)  TempSrc: Oral Oral Oral Oral  SpO2: 96% 98% 96% 95%  Weight:      Height:        Intake/Output Summary (Last 24 hours) at 06/12/2021 0731 Last data filed at 06/11/2021 1944 Gross per 24 hour  Intake 6 ml  Output 275 ml  Net -269 ml   Last 3 Weights 06/10/2021 06/09/2021 06/08/2021  Weight (lbs) 218 lb 218 lb 7.6 oz 227 lb  Weight (kg) 98.884 kg 99.1 kg 102.967 kg      Telemetry    Atrial fibrillation, heart rate 110 to 120 bpm, 3-hour period of normal sinus rhythm overnight noted - Personally Reviewed   Physical Exam  Alert, oriented, NAD GEN: No acute distress.   Neck: No JVD Cardiac: irregularly irregular, no murmurs, rubs, or gallops.  Respiratory: Clear to auscultation bilaterally. GI: Soft, nontender, non-distended  MS: No  edema; No deformity. Neuro:  Nonfocal  Psych: Normal affect   Labs    High Sensitivity Troponin:   Recent Labs  Lab 06/08/21 2045 06/08/21 2137 06/09/21 0034 06/09/21 0152  TROPONINIHS 16 15 13 17      Chemistry Recent Labs  Lab 06/08/21 2045 06/09/21 0152 06/10/21 0832 06/11/21 0040 06/12/21 0024  NA 140   < > 139 136 131*  K 4.4   < > 3.3* 4.3 4.6  CL 109   < > 104 103 98  CO2 21*   < > 26 24 23   GLUCOSE 107*   < > 102* 102* 114*  BUN 29*   < > 27* 30* 30*  CREATININE 1.53*   < > 1.82* 1.88* 1.77*  CALCIUM 8.8*   < > 8.7* 8.6* 8.7*  MG 2.1  --  2.0  --   --   PROT 7.2  --   --   --   --   ALBUMIN 3.1*  --   --   --   --   AST 21  --   --   --   --   ALT 18  --   --   --   --   ALKPHOS 110  --   --   --   --  BILITOT 1.1  --   --   --   --   GFRNONAA 49*   < > 39* 38* 41*  ANIONGAP 10   < > 9 9 10    < > = values in this interval not displayed.    Lipids  Recent Labs  Lab 06/09/21 0152  CHOL 111  TRIG 40  HDL 32*  LDLCALC 71  CHOLHDL 3.5    Hematology Recent Labs  Lab 06/08/21 2045 06/09/21 0152  WBC 7.4 7.6  RBC 4.53 4.87  HGB 12.1* 13.0  HCT 39.9 42.7  MCV 88.1 87.7  MCH 26.7 26.7  MCHC 30.3 30.4  RDW 15.8* 15.7*  PLT 205 206   Thyroid  Recent Labs  Lab 06/08/21 2045  TSH 3.281    BNP Recent Labs  Lab 06/08/21 2045  BNP 1,105.3*    DDimer No results for input(s): DDIMER in the last 168 hours.   Radiology    ECHO TEE  Result Date: 06/10/2021    TRANSESOPHOGEAL ECHO REPORT   Patient Name:   BINYAMIN NELIS [facility] Date of Exam: 06/10/2021 Medical Rec #:  [id number]          Height:       71.0 in Accession #:    [id number]         Weight:       218.0 lb Date of Birth:  03/08/1951          BSA:          2.187 m Patient Age:    70 years           BP:           94/63 mmHg Patient Gender: M                  HR:           127 bpm. Exam Location:  Inpatient Procedure: 3D Echo, Transesophageal Echo, Cardiac Doppler and Color Doppler Indications:      Atrial Fibrilation  History:         Patient has prior history of Echocardiogram examinations, most                  recent 06/09/2021.  Sonographer:     Philipp Deputy RDCS Referring Phys:  [id number] [facility] B ROBERTS Diagnosing Phys: Rudean Haskell MD PROCEDURE: After discussion of the risks and benefits of a TEE, an informed consent was obtained from the patient. The transesophogeal probe was passed without difficulty through the esophogus of the patient. Imaged were obtained with the patient in a left lateral decubitus position. Sedation performed by different physician. The patient was monitored while under deep sedation. Anesthestetic sedation was provided intravenously by Anesthesiology: 203mg  of Propofol. Image quality was adequate. The patient developed no complications during the procedure. IMPRESSIONS  1. There is linear echodensity in the left atrial appendage with heavy smoke and stalk attachment. Contrast study done; though not classical appearnce in the clinical syndrome of atrial fibrillation did not proceed to cardioversion.  2. Left ventricular ejection fraction, by estimation, is 20 to 25%. The left ventricle has severely decreased function. The left ventricle demonstrates global hypokinesis.  3. Right ventricular systolic function is normal. The right ventricular size is normal.  4. Left atrial size was moderately dilated. A left atrial/left atrial appendage thrombus was detected.  5. The mitral valve is normal in structure. Mild to moderate mitral valve regurgitation. No evidence of mitral stenosis.  6. The aortic  valve is tricuspid. Aortic valve regurgitation is not visualized. FINDINGS  Left Ventricle: Left ventricular ejection fraction, by estimation, is 20 to 25%. The left ventricle has severely decreased function. The left ventricle demonstrates global hypokinesis. Definity contrast agent was given IV to delineate the left ventricular endocardial borders. The left ventricular  internal cavity size was normal in size. Right Ventricle: The right ventricular size is normal. No increase in right ventricular wall thickness. Right ventricular systolic function is normal. Left Atrium: Left atrial size was moderately dilated. A left atrial/left atrial appendage thrombus was detected. Right Atrium: Right atrial size was normal in size. Pericardium: There is no evidence of pericardial effusion. Mitral Valve: The mitral valve is normal in structure. Mild to moderate mitral valve regurgitation. No evidence of mitral valve stenosis. Tricuspid Valve: The tricuspid valve is normal in structure. Tricuspid valve regurgitation is mild. Aortic Valve: The aortic valve is tricuspid. Aortic valve regurgitation is not visualized. Pulmonic Valve: The pulmonic valve was normal in structure. Pulmonic valve regurgitation is not visualized. Aorta: The aortic root and ascending aorta are structurally normal, with no evidence of dilitation. IAS/Shunts: The atrial septum is grossly normal. Rudean Haskell MD Electronically signed by Rudean Haskell MD Signature Date/Time: 06/10/2021/11:54:59 AM    Final      Patient Profile     70 y.o. male with new onset atrial fibrillation of unknown duration, severe LV dysfunction, and acute systolic heart failure  Assessment & Plan    Atrial fibrillation with RVR: LAA thrombus on TEE so DCCV contraindicated. Plan: continue rate control until patient completes 3 weeks of therapeutic anticoagulation, then proceed with cardioversion. Heart rate improving but not optimal. Transition IV to PO amiodarone today. Increase metoprolol succinate. Continue digoxin.  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 Continue digoxin, metoprolol succinate. BP is soft and won't allow for aggressive CHF med titration. CKD 3: stable creatinine - 1.88 yesterday --> 1.77 today. 4.  Gout: Per hospitalist team  Disposition: We will transition from IV amiodarone to  oral amiodarone 400 mg twice daily when the current bag runs out today.  We will increase metoprolol succinate to 37.5 mg daily.  BP remains in the low 90s.  As long as the patient's heart rate is reasonably controlled tomorrow, I think he could be considered for discharge over the weekend possibly even tomorrow.  His renal function has stabilized.  I am hopeful that he will convert to sinus rhythm over the next few weeks.  We will go ahead and arrange follow-up in the atrial fibrillation clinic, anticipating possible discharge over the weekend.  If he does not convert spontaneously to sinus rhythm, he will need elective cardioversion in 3 weeks.  For questions or updates, please contact [place] Please consult [email or web address] for contact info under        Signed, Sherren Mocha, MD  06/12/2021, 7:31 AM

## 2021-06-12 NOTE — Progress Notes (Signed)
Mobility Specialist Progress Note:   06/12/21 1624  Mobility  Activity Ambulated in hall  Level of Assistance Standby assist, set-up cues, supervision of patient - no hands on  Assistive Device None  Distance Ambulated (ft) 300 ft  Mobility Ambulated with assistance in hallway  Mobility Response Tolerated well  Mobility performed by Mobility specialist  $Mobility charge 1 Mobility   SATURATION QUALIFICATIONS: (This note is used to comply with regulatory documentation for home oxygen)  Patient Saturations on Room Air at Rest = 97%  Patient Saturations on [place] while Ambulating = 93%  Patient Saturations on 0 Liters of oxygen while Ambulating = 93-99%  Please briefly explain why patient needs home oxygen: Pt received in bed willing to participate in mobility. Second walk today. Pt returned to EOB with call bell in reach and all needs met.   [facility] Health and safety inspector Phone [phone]

## 2021-06-12 NOTE — Progress Notes (Signed)
Amiodarone stopped . Iv site removed due to swelling. IV team consulted.

## 2021-06-12 NOTE — Progress Notes (Signed)
Mobility Specialist Progress Note:   06/12/21 1126  Mobility  Activity Ambulated in hall  Level of Assistance Standby assist, set-up cues, supervision of patient - no hands on  Assistive Device None  Distance Ambulated (ft) 300 ft  Mobility Ambulated with assistance in hallway  Mobility Response Tolerated well  Mobility performed by Mobility specialist  $Mobility charge 1 Mobility   Pt received in bed willing to participate in mobility. No complaints of pain and asymptomatic. Pt returned to bed with call bell in reach and all needs met.   [facility] Health and safety inspector Phone [phone]

## 2021-06-12 NOTE — Progress Notes (Signed)
Amiodarone gtt restarted.tolerating well.

## 2021-06-13 DIAGNOSIS — I4891 Unspecified atrial fibrillation: Secondary | ICD-10-CM | POA: Diagnosis not present

## 2021-06-13 LAB — BASIC METABOLIC PANEL
Anion gap: 10 (ref 5–15)
BUN: 38 mg/dL — ABNORMAL HIGH (ref 8–23)
CO2: 24 mmol/L (ref 22–32)
Calcium: 8.5 mg/dL — ABNORMAL LOW (ref 8.9–10.3)
Chloride: 100 mmol/L (ref 98–111)
Creatinine, Ser: 1.68 mg/dL — ABNORMAL HIGH (ref 0.61–1.24)
GFR, Estimated: 43 mL/min — ABNORMAL LOW (ref >60)
Glucose, Bld: 118 mg/dL — ABNORMAL HIGH (ref 70–99)
Potassium: 4.3 mmol/L (ref 3.5–5.1)
Sodium: 134 mmol/L — ABNORMAL LOW (ref 135–145)

## 2021-06-13 MED ORDER — ZOLPIDEM TARTRATE 5 MG PO TABS
5.0000 mg | ORAL_TABLET | Freq: Every evening | ORAL | Status: DC | PRN
  Administered 2021-06-14 – 2021-06-15 (×2): 5 mg via ORAL
  Filled 2021-06-13 (×2): qty 1

## 2021-06-13 MED ORDER — METOPROLOL SUCCINATE ER 50 MG PO TB24
50.0000 mg | ORAL_TABLET | Freq: Every day | ORAL | Status: DC
  Administered 2021-06-13: 50 mg via ORAL
  Filled 2021-06-13: qty 1

## 2021-06-13 MED ORDER — AMIODARONE HCL 200 MG PO TABS
400.0000 mg | ORAL_TABLET | Freq: Two times a day (BID) | ORAL | Status: DC
  Administered 2021-06-13 – 2021-06-15 (×5): 400 mg via ORAL
  Filled 2021-06-13 (×5): qty 2

## 2021-06-13 NOTE — Progress Notes (Signed)
Pt ambulated in a hallway almost 231ft, HR went upto 128 and stayed between 120-128 most of the time, pt felt some fatigue and SOB but oxygen maintained at 96% in RA while ambulating.  Will continue to monitor the patient  Palma Holter, RN

## 2021-06-13 NOTE — Progress Notes (Signed)
Progress Note  Patient Name: Brian Weeks Date of Encounter: 06/13/2021  [facility] Cardiologist: Dr Burt Knack  Subjective   No CP or dyspnea  Inpatient Medications    Scheduled Meds:  allopurinol  100 mg Oral Daily   amiodarone  400 mg Oral BID   apixaban  5 mg Oral BID   atorvastatin  40 mg Oral Daily   digoxin  0.125 mg Oral Daily   influenza vaccine adjuvanted  0.5 mL Intramuscular Tomorrow-1000   metoprolol succinate  37.5 mg Oral Daily   predniSONE  40 mg Oral Q breakfast   sodium chloride flush  3 mL Intravenous Q12H   Continuous Infusions:  sodium chloride     PRN Meds: sodium chloride, acetaminophen **OR** acetaminophen, sodium chloride flush   Vital Signs    Vitals:   06/12/21 2337 06/13/21 0359 06/13/21 0445 06/13/21 0546  BP: 99/77 95/73    Pulse: 99 (!) 105 90 87  Resp: 19 20    Temp: 97.9 F (36.6 C) 98.1 F (36.7 C)    TempSrc: Oral Oral    SpO2: 95% 96% 90% 93%  Weight:      Height:        Intake/Output Summary (Last 24 hours) at 06/13/2021 0807 Last data filed at 06/13/2021 0410 Gross per 24 hour  Intake 501.72 ml  Output 1150 ml  Net -648.28 ml   Last 3 Weights 06/10/2021 06/09/2021 06/08/2021  Weight (lbs) 218 lb 218 lb 7.6 oz 227 lb  Weight (kg) 98.884 kg 99.1 kg 102.967 kg      Telemetry    Atrial fibrillation - Personally Reviewed   Physical Exam   GEN: No acute distress.   Neck: No JVD Cardiac: irregular Respiratory: Clear to auscultation bilaterally. GI: Soft, nontender, non-distended  MS: No edema Neuro:  Nonfocal  Psych: Normal affect   Labs    High Sensitivity Troponin:   Recent Labs  Lab 06/08/21 2045 06/08/21 2137 06/09/21 0034 06/09/21 0152  TROPONINIHS 16 15 13 17      Chemistry Recent Labs  Lab 06/08/21 2045 06/09/21 0152 06/10/21 0832 06/11/21 0040 06/12/21 0024 06/13/21 0021  NA 140   < > 139 136 131* 134*  K 4.4   < > 3.3* 4.3 4.6 4.3  CL 109   < > 104 103 98 100  CO2 21*   <  > 26 24 23 24   GLUCOSE 107*   < > 102* 102* 114* 118*  BUN 29*   < > 27* 30* 30* 38*  CREATININE 1.53*   < > 1.82* 1.88* 1.77* 1.68*  CALCIUM 8.8*   < > 8.7* 8.6* 8.7* 8.5*  MG 2.1  --  2.0  --   --   --   PROT 7.2  --   --   --   --   --   ALBUMIN 3.1*  --   --   --   --   --   AST 21  --   --   --   --   --   ALT 18  --   --   --   --   --   ALKPHOS 110  --   --   --   --   --   BILITOT 1.1  --   --   --   --   --   GFRNONAA 49*   < > 39* 38* 41* 43*  ANIONGAP 10   < > 9  9 10 10    < > = values in this interval not displayed.    Lipids  Recent Labs  Lab 06/09/21 0152  CHOL 111  TRIG 40  HDL 32*  LDLCALC 71  CHOLHDL 3.5    Hematology Recent Labs  Lab 06/08/21 2045 06/09/21 0152  WBC 7.4 7.6  RBC 4.53 4.87  HGB 12.1* 13.0  HCT 39.9 42.7  MCV 88.1 87.7  MCH 26.7 26.7  MCHC 30.3 30.4  RDW 15.8* 15.7*  PLT 205 206   Thyroid  Recent Labs  Lab 06/08/21 2045  TSH 3.281    BNP Recent Labs  Lab 06/08/21 2045  BNP 1,105.3*    Patient Profile     69 y.o. male admitted with acute systolic congestive heart failure, cardiomyopathy and new onset atrial fibrillation.  Echocardiogram showed ejection fraction 20 to 25%, mild left ventricular enlargement, moderate RV dysfunction, moderate pulmonary hypertension, mild biatrial enlargement, mild to moderate mitral regurgitation, moderate tricuspid regurgitation.  Transesophageal echocardiogram suggested left atrial appendage thrombus.  Assessment & Plan    1 acute systolic congestive heart failure-patient appears to be euvolemic on examination.  Presently not on diuretics.  2 cardiomyopathy-etiology unclear.  Potentially tachycardia mediated related to atrial fibrillation.  We will increase Toprol to 50 mg daily.  His blood pressure will not allow an ARB at this point but will consider in the future.  Continue digoxin.  Will need repeat echocardiogram once sinus rhythm has been reestablished and if LV function remains  decreased would need ischemia evaluation.  3 persistent atrial fibrillation-patient's heart rate remains upper normal to mildly elevated.  Continue amiodarone 400 mg twice daily for 1 week then decrease to 200 mg daily thereafter.  Continue digoxin.  Increase Toprol to 50 mg daily.  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  Plan will be to continue apixaban for 3 to 4 weeks and then cardiovert.  4 chronic stage IIIa kidney disease-we will need follow-up as an outpatient.  Will ambulate today and follow heart rate/blood pressure.  If stable he can be discharged later today and we will arrange follow-up in atrial fibrillation clinic.  Plan cardioversion as outlined above in 3 to 4 weeks.  Likely needs TEE guided cardioversion as there was a question of left atrial appendage thrombus on recent TEE.    For questions or updates, please contact [place] Please consult [email or web address] for contact info under        Signed, Kirk Ruths, MD  06/13/2021, 8:07 AM

## 2021-06-13 NOTE — Progress Notes (Signed)
PROGRESS NOTE    Brian Weeks  ZTI:[id number] DOB: [birth year] DOA: 06/08/2021 PCP: Kristen Loader, FNP   Brief Narrative:  Brian Weeks is a 70 y.o. male with a PMH significant for gout, HLD. He presented from home to the ED on 06/08/2021 with exertional shortness of breath several weeks in the ED, it was found that he had A. fib RVR.  Patient was treated with Cardizem drip and Eliquis.  Patient was admitted to medicine service for further workup and management of new onset A. fib as outlined in detail below.  Assessment & Plan:   Principal Problem:   Atrial fibrillation with RVR (HCC) Active Problems:   Acute CHF (congestive heart failure) (HCC)   AKI (acute kidney injury) (HCC)   Prolonged QT interval  New onset A. fib with RVR: Echo shows 20 to 25% ejection fraction with left ventricular global hypokinesis, remains on amiodarone drip but rates around 108 today. Plan was to do TEE cardioversion however reportedly he was found to have possible clot in left atrial appendage so DCCV was not pursued.  Patient was transitioned to amiodarone 400 mg p.o. twice daily, Toprol-XL was increased to 37.5 and digoxin was added.  When walking, patient's heart rate jumped between 120-128 and remained in that area for most part.  Also felt fatigued.  Does not seem to be ready for discharge as of yet.  Toprol-XL has been increased to 50 mg today by cardiology.  His blood pressure also is tenuous.  Appreciate cardiology help.  Will monitor overnight.  Defer further management to cardiology.  Acute systolic congestive heart failure: Echo results as above.  Received IV Lasix 2 days ago, blood pressure low, not allowing further IV Lasix.  No shortness of breath, no crackles on exam.  Maintain oxygen saturation over 90% on room air with exertion as well.  Defer to cardiology for management.   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  His creatinine has remained stable around 1.6 since  admission.  No prior results available in the chart for [place] to compare.  I do not think he has AKI.  Hypokalemia: Resolved.  Left wrist gout: Feeling better.  Continue prednisone and allopurinol.   DVT prophylaxis:    Code Status: Full Code  Family Communication: None present at bedside.  Plan of care discussed with patient in length and he verbalized understanding and agreed with it.   Status is: Inpatient  Remains inpatient appropriate because: Needs further management.  Estimated body mass index is 30.4 kg/m as calculated from the following:   Height as of this encounter: 5\' 11"  (1.803 m).   Weight as of this encounter: 98.9 kg.     Nutritional Assessment: Body mass index is 30.4 kg/m.Marland Kitchen Seen by dietician.  I agree with the assessment and plan as outlined below: Nutrition Status:  Skin Assessment: I have examined the patient's skin and I agree with the wound assessment as performed by the wound care RN as outlined below:    Consultants:  Cardiology  Procedures:  TEE  Antimicrobials:  Anti-infectives (From admission, onward)    None          Subjective: Patient seen and examined.  He states that he feels better, no chest pain or shortness of breath and left wrist is feeling better as well.  No other complaint.  Objective: Vitals:   06/13/21 0445 06/13/21 0546 06/13/21 0734 06/13/21 1113  BP:   96/73 95/82  Pulse: 90 87 (!)  102 (!) 103  Resp:   20 20  Temp:   97.7 F (36.5 C) (!) 97.5 F (36.4 C)  TempSrc:   Oral Oral  SpO2: 90% 93% 96% 95%  Weight:      Height:        Intake/Output Summary (Last 24 hours) at 06/13/2021 1143 Last data filed at 06/13/2021 0800 Gross per 24 hour  Intake 651.71 ml  Output 1350 ml  Net -698.29 ml    Filed Weights   06/08/21 2028 06/09/21 2144 06/10/21 0902  Weight: 103 kg 99.1 kg 98.9 kg    Examination:  General exam: Appears calm and comfortable  Respiratory system: Clear to auscultation. Respiratory effort  normal. Cardiovascular system: S1 & S2 heard, irregularly irregular rate and rhythm. No JVD, murmurs, rubs, gallops or clicks. No pedal edema. Gastrointestinal system: Abdomen is nondistended, soft and nontender. No organomegaly or masses felt. Normal bowel sounds heard. Central nervous system: Alert and oriented. No focal neurological deficits. Extremities: Symmetric 5 x 5 power. Skin: No rashes, lesions or ulcers.  Psychiatry: Judgement and insight appear normal. Mood & affect appropriate.   Data Reviewed: I have personally reviewed following labs and imaging studies  CBC: Recent Labs  Lab 06/08/21 2045 06/09/21 0152  WBC 7.4 7.6  NEUTROABS 5.0  --   HGB 12.1* 13.0  HCT 39.9 42.7  MCV 88.1 87.7  PLT 205 540    Basic Metabolic Panel: Recent Labs  Lab 06/08/21 2045 06/09/21 0152 06/10/21 0832 06/11/21 0040 06/12/21 0024 06/13/21 0021  NA 140 140 139 136 131* 134*  K 4.4 4.0 3.3* 4.3 4.6 4.3  CL 109 105 104 103 98 100  CO2 21* 24 26 24 23 24   GLUCOSE 107* 146* 102* 102* 114* 118*  BUN 29* 27* 27* 30* 30* 38*  CREATININE 1.53* 1.65* 1.82* 1.88* 1.77* 1.68*  CALCIUM 8.8* 9.2 8.7* 8.6* 8.7* 8.5*  MG 2.1  --  2.0  --   --   --     GFR: Estimated Creatinine Clearance: 49 mL/min (A) (by C-G formula based on SCr of 1.68 mg/dL (H)). Liver Function Tests: Recent Labs  Lab 06/08/21 2045  AST 21  ALT 18  ALKPHOS 110  BILITOT 1.1  PROT 7.2  ALBUMIN 3.1*    No results for input(s): LIPASE, AMYLASE in the last 168 hours. No results for input(s): AMMONIA in the last 168 hours. Coagulation Profile: No results for input(s): INR, PROTIME in the last 168 hours. Cardiac Enzymes: No results for input(s): CKTOTAL, CKMB, CKMBINDEX, TROPONINI in the last 168 hours. BNP (last 3 results) No results for input(s): PROBNP in the last 8760 hours. HbA1C: Recent Labs    06/11/21 0040  HGBA1C 6.1*    CBG: No results for input(s): GLUCAP in the last 168 hours. Lipid  Profile: No results for input(s): CHOL, HDL, LDLCALC, TRIG, CHOLHDL, LDLDIRECT in the last 72 hours.  Thyroid Function Tests: No results for input(s): TSH, T4TOTAL, FREET4, T3FREE, THYROIDAB in the last 72 hours.  Anemia Panel: No results for input(s): VITAMINB12, FOLATE, FERRITIN, TIBC, IRON, RETICCTPCT in the last 72 hours. Sepsis Labs: No results for input(s): PROCALCITON, LATICACIDVEN in the last 168 hours.  Recent Results (from the past 240 hour(s))  Urine Culture     Status: None   Collection Time: 06/08/21  7:37 PM   Specimen: Urine, Clean Catch  Result Value Ref Range Status   Specimen Description URINE, CLEAN CATCH  Final   Special Requests NONE  Final   Culture   Final    NO GROWTH Performed at [facility], [place] [address] [place], [place] [address]    Report Status 06/09/2021 FINAL  Final  Resp Panel by RT-PCR (Flu A&B, Covid) Nasopharyngeal Swab     Status: None   Collection Time: 06/08/21  8:36 PM   Specimen: Nasopharyngeal Swab; Nasopharyngeal(NP) swabs in vial transport medium  Result Value Ref Range Status   SARS Coronavirus 2 by RT PCR NEGATIVE NEGATIVE Final    Comment: (NOTE) SARS-CoV-2 target nucleic acids are NOT DETECTED.  The SARS-CoV-2 RNA is generally detectable in upper respiratory specimens during the acute phase of infection. The lowest concentration of SARS-CoV-2 viral copies this assay can detect is 138 copies/mL. A negative result does not preclude SARS-Cov-2 infection and should not be used as the sole basis for treatment or other patient management decisions. A negative result may occur with  improper specimen collection/handling, submission of specimen other than nasopharyngeal swab, presence of viral mutation(s) within the areas targeted by this assay, and inadequate number of viral copies(<138 copies/mL). A negative result must be combined with clinical observations, patient history, and epidemiological information. The expected  result is Negative.  Fact Sheet for Patients:  EntrepreneurPulse.com.au  Fact Sheet for Healthcare Providers:  IncredibleEmployment.be  This test is no t yet approved or cleared by the [place] FDA and  has been authorized for detection and/or diagnosis of SARS-CoV-2 by FDA under an Emergency Use Authorization (EUA). This EUA will remain  in effect (meaning this test can be used) for the duration of the COVID-19 declaration under Section 564(b)(1) of the Act, 21 U.S.C.section 360bbb-3(b)(1), unless the authorization is terminated  or revoked sooner.       Influenza A by PCR NEGATIVE NEGATIVE Final   Influenza B by PCR NEGATIVE NEGATIVE Final    Comment: (NOTE) The Xpert Xpress SARS-CoV-2/FLU/RSV plus assay is intended as an aid in the diagnosis of influenza from Nasopharyngeal swab specimens and should not be used as a sole basis for treatment. Nasal washings and aspirates are unacceptable for Xpert Xpress SARS-CoV-2/FLU/RSV testing.  Fact Sheet for Patients: EntrepreneurPulse.com.au  Fact Sheet for Healthcare Providers: IncredibleEmployment.be  This test is not yet approved or cleared by the [place] FDA and has been authorized for detection and/or diagnosis of SARS-CoV-2 by FDA under an Emergency Use Authorization (EUA). This EUA will remain in effect (meaning this test can be used) for the duration of the COVID-19 declaration under Section 564(b)(1) of the Act, 21 U.S.C. section 360bbb-3(b)(1), unless the authorization is terminated or revoked.  Performed at [facility], [place] [address], [place], [place] [address]   MRSA Next Gen by PCR, Nasal     Status: None   Collection Time: 06/09/21  9:48 PM   Specimen: Nasal Mucosa; Nasal Swab  Result Value Ref Range Status   MRSA by PCR Next Gen NOT DETECTED NOT DETECTED Final    Comment: (NOTE) The GeneXpert MRSA Assay (FDA approved for  NASAL specimens only), is one component of a comprehensive MRSA colonization surveillance program. It is not intended to diagnose MRSA infection nor to guide or monitor treatment for MRSA infections. Test performance is not FDA approved in patients less than 25 years old. Performed at [facility], [place] [address], [place], [place] [address]        Radiology Studies: No results found.  Scheduled Meds:  allopurinol  100 mg Oral Daily   amiodarone  400 mg Oral BID   apixaban  5 mg Oral BID   atorvastatin  40 mg Oral Daily   digoxin  0.125 mg Oral Daily   influenza vaccine adjuvanted  0.5 mL Intramuscular Tomorrow-1000   metoprolol succinate  50 mg Oral Daily   predniSONE  40 mg Oral Q breakfast   sodium chloride flush  3 mL Intravenous Q12H   Continuous Infusions:  sodium chloride       LOS: 5 days   Time spent: 27 minutes   Darliss Cheney, MD [facility]  06/13/2021, 11:43 AM  Please page via [place] [place] and do not message via secure chat for anything urgent. Secure chat can be used for anything non urgent.  How to contact the [facility] Attending or Consulting provider [place] or covering provider during after hours [place], for this patient?  Check the care team in [facility] and look for a) attending/consulting [facility] provider listed and b) the [facility] team listed. Page or secure chat [phone]. Log into [email or web address] and use [place]'s universal password to access. If you do not have the password, please contact the hospital operator. Locate the [facility] provider you are looking for under [facility] and page to a number that you can be directly reached. If you still have difficulty reaching the provider, please page the [facility] (Director on Call) for the Hospitalists listed on amion for assistance.

## 2021-06-14 DIAGNOSIS — I4891 Unspecified atrial fibrillation: Secondary | ICD-10-CM | POA: Diagnosis not present

## 2021-06-14 DIAGNOSIS — I5021 Acute systolic (congestive) heart failure: Secondary | ICD-10-CM | POA: Diagnosis not present

## 2021-06-14 LAB — BASIC METABOLIC PANEL
Anion gap: 9 (ref 5–15)
BUN: 57 mg/dL — ABNORMAL HIGH (ref 8–23)
CO2: 21 mmol/L — ABNORMAL LOW (ref 22–32)
Calcium: 8.3 mg/dL — ABNORMAL LOW (ref 8.9–10.3)
Chloride: 99 mmol/L (ref 98–111)
Creatinine, Ser: 1.99 mg/dL — ABNORMAL HIGH (ref 0.61–1.24)
GFR, Estimated: 35 mL/min — ABNORMAL LOW (ref >60)
Glucose, Bld: 108 mg/dL — ABNORMAL HIGH (ref 70–99)
Potassium: 4.8 mmol/L (ref 3.5–5.1)
Sodium: 129 mmol/L — ABNORMAL LOW (ref 135–145)

## 2021-06-14 MED ORDER — METOPROLOL SUCCINATE ER 25 MG PO TB24
25.0000 mg | ORAL_TABLET | Freq: Once | ORAL | Status: DC
  Filled 2021-06-14: qty 1

## 2021-06-14 MED ORDER — METOPROLOL SUCCINATE ER 50 MG PO TB24
75.0000 mg | ORAL_TABLET | Freq: Every day | ORAL | Status: DC
  Administered 2021-06-14 – 2021-06-15 (×2): 75 mg via ORAL
  Filled 2021-06-14 (×2): qty 1

## 2021-06-14 NOTE — Progress Notes (Signed)
PROGRESS NOTE    Brian Weeks  YKD:[id number] DOB: 1951-02-18 DOA: 06/08/2021 PCP: Kristen Loader, FNP   Brief Narrative:  Brian Weeks is a 70 y.o. male with a PMH significant for gout, HLD. He presented from home to the ED on 06/08/2021 with exertional shortness of breath several weeks in the ED, it was found that he had A. fib RVR.  Patient was treated with Cardizem drip and Eliquis.  Patient was admitted to medicine service for further workup and management of new onset A. fib as outlined in detail below.  Assessment & Plan:   Principal Problem:   Atrial fibrillation with RVR (HCC) Active Problems:   Acute CHF (congestive heart failure) (HCC)   AKI (acute kidney injury) (HCC)   Prolonged QT interval  New onset A. fib with RVR: Echo shows 20 to 25% ejection fraction with left ventricular global hypokinesis.  No complaints at rest.  Heart rate around 110 at rest with jumping sometimes to 120, with walking HM 2.35.  Toprol-XL increased to 75 mg.  After that, patient walked again, heart rate remained around 110 but he was winded with very minor activity.  Patient was very aggressive and eager to go home this morning even with high heart rate, he was even considering signing AMA.  Later on, his wife convinced him to stay.  Now he is willing to stay.  I will give him another dose of 25 mg Toprol-XL to help control his heart rate.  He is not hypoxic.  Will monitor overnight.  Acute systolic congestive heart failure: Echo results as above.  Received IV Lasix 2 days ago, blood pressure low, not allowing further IV Lasix.  No shortness of breath, no crackles on exam.  Maintain oxygen saturation over 90% on room air with exertion as well.  Defer to cardiology for management.   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  His creatinine has remained stable around 1.6 since admission.  No prior results available in the chart for [place] to compare.  I do not think he has  AKI.  Hypokalemia: Resolved.  Left wrist gout: Feeling better.  Continue prednisone and allopurinol.   DVT prophylaxis:    Code Status: Full Code  Family Communication: Patient's wife present at bedside.  Plan of care discussed with patient in length and he verbalized understanding and agreed with it.   Status is: Inpatient  Remains inpatient appropriate because: Needs further management.  Estimated body mass index is 30.4 kg/m as calculated from the following:   Height as of this encounter: 5\' 11"  (1.803 m).   Weight as of this encounter: 98.9 kg.     Nutritional Assessment: Body mass index is 30.4 kg/m.Marland Kitchen Seen by dietician.  I agree with the assessment and plan as outlined below: Nutrition Status:  Skin Assessment: I have examined the patient's skin and I agree with the wound assessment as performed by the wound care RN as outlined below:    Consultants:  Cardiology  Procedures:  TEE  Antimicrobials:  Anti-infectives (From admission, onward)    None          Subjective: Patient seen and examined.  No complaints, no chest pain or shortness of breath but very aggressive and eager to go home.  Does not want to lay in the hospital.  Later on he was convinced to stay.  Objective: Vitals:   06/13/21 1951 06/13/21 2226 06/14/21 0731 06/14/21 1127  BP: 113/75 (!) 113/91 109/74 (!) 110/95  Pulse: (!) 105 99 (!) 109 (!) 101  Resp: 16 20 20 20   Temp: 98.2 F (36.8 C) 98.1 F (36.7 C) 97.6 F (36.4 C) 97.6 F (36.4 C)  TempSrc: Oral Oral Oral Oral  SpO2: 97% 98% 90% 93%  Weight:      Height:        Intake/Output Summary (Last 24 hours) at 06/14/2021 1340 Last data filed at 06/14/2021 0800 Gross per 24 hour  Intake 480 ml  Output 700 ml  Net -220 ml    Filed Weights   06/08/21 2028 06/09/21 2144 06/10/21 0902  Weight: 103 kg 99.1 kg 98.9 kg    Examination:  General exam: Appears calm and comfortable  Respiratory system: Clear to auscultation.  Respiratory effort normal. Cardiovascular system: S1 & S2 heard, irregularly irregular rate and rhythm. No JVD, murmurs, rubs, gallops or clicks. No pedal edema. Gastrointestinal system: Abdomen is nondistended, soft and nontender. No organomegaly or masses felt. Normal bowel sounds heard. Central nervous system: Alert and oriented. No focal neurological deficits. Extremities: Symmetric 5 x 5 power. Skin: No rashes, lesions or ulcers.  Psychiatry: Judgement and insight appear normal. Mood & affect appropriate.    Data Reviewed: I have personally reviewed following labs and imaging studies  CBC: Recent Labs  Lab 06/08/21 2045 06/09/21 0152  WBC 7.4 7.6  NEUTROABS 5.0  --   HGB 12.1* 13.0  HCT 39.9 42.7  MCV 88.1 87.7  PLT 205 893    Basic Metabolic Panel: Recent Labs  Lab 06/08/21 2045 06/09/21 0152 06/10/21 0832 06/11/21 0040 06/12/21 0024 06/13/21 0021 06/14/21 0146  NA 140   < > 139 136 131* 134* 129*  K 4.4   < > 3.3* 4.3 4.6 4.3 4.8  CL 109   < > 104 103 98 100 99  CO2 21*   < > 26 24 23 24  21*  GLUCOSE 107*   < > 102* 102* 114* 118* 108*  BUN 29*   < > 27* 30* 30* 38* 57*  CREATININE 1.53*   < > 1.82* 1.88* 1.77* 1.68* 1.99*  CALCIUM 8.8*   < > 8.7* 8.6* 8.7* 8.5* 8.3*  MG 2.1  --  2.0  --   --   --   --    < > = values in this interval not displayed.    GFR: Estimated Creatinine Clearance: 41.4 mL/min (A) (by C-G formula based on SCr of 1.99 mg/dL (H)). Liver Function Tests: Recent Labs  Lab 06/08/21 2045  AST 21  ALT 18  ALKPHOS 110  BILITOT 1.1  PROT 7.2  ALBUMIN 3.1*    No results for input(s): LIPASE, AMYLASE in the last 168 hours. No results for input(s): AMMONIA in the last 168 hours. Coagulation Profile: No results for input(s): INR, PROTIME in the last 168 hours. Cardiac Enzymes: No results for input(s): CKTOTAL, CKMB, CKMBINDEX, TROPONINI in the last 168 hours. BNP (last 3 results) No results for input(s): PROBNP in the last 8760  hours. HbA1C: No results for input(s): HGBA1C in the last 72 hours.  CBG: No results for input(s): GLUCAP in the last 168 hours. Lipid Profile: No results for input(s): CHOL, HDL, LDLCALC, TRIG, CHOLHDL, LDLDIRECT in the last 72 hours.  Thyroid Function Tests: No results for input(s): TSH, T4TOTAL, FREET4, T3FREE, THYROIDAB in the last 72 hours.  Anemia Panel: No results for input(s): VITAMINB12, FOLATE, FERRITIN, TIBC, IRON, RETICCTPCT in the last 72 hours. Sepsis Labs: No results for input(s): PROCALCITON, LATICACIDVEN  in the last 168 hours.  Recent Results (from the past 240 hour(s))  Urine Culture     Status: None   Collection Time: 06/08/21  7:37 PM   Specimen: Urine, Clean Catch  Result Value Ref Range Status   Specimen Description URINE, CLEAN CATCH  Final   Special Requests NONE  Final   Culture   Final    NO GROWTH Performed at [facility], [address], [place], [place] [address]    Report Status 06/09/2021 FINAL  Final  Resp Panel by RT-PCR (Flu A&B, Covid) Nasopharyngeal Swab     Status: None   Collection Time: 06/08/21  8:36 PM   Specimen: Nasopharyngeal Swab; Nasopharyngeal(NP) swabs in vial transport medium  Result Value Ref Range Status   SARS Coronavirus 2 by RT PCR NEGATIVE NEGATIVE Final    Comment: (NOTE) SARS-CoV-2 target nucleic acids are NOT DETECTED.  The SARS-CoV-2 RNA is generally detectable in upper respiratory specimens during the acute phase of infection. The lowest concentration of SARS-CoV-2 viral copies this assay can detect is 138 copies/mL. A negative result does not preclude SARS-Cov-2 infection and should not be used as the sole basis for treatment or other patient management decisions. A negative result may occur with  improper specimen collection/handling, submission of specimen other than nasopharyngeal swab, presence of viral mutation(s) within the areas targeted by this assay, and inadequate number of viral copies(<138  copies/mL). A negative result must be combined with clinical observations, patient history, and epidemiological information. The expected result is Negative.  Fact Sheet for Patients:  EntrepreneurPulse.com.au  Fact Sheet for Healthcare Providers:  IncredibleEmployment.be  This test is no t yet approved or cleared by the [place] FDA and  has been authorized for detection and/or diagnosis of SARS-CoV-2 by FDA under an Emergency Use Authorization (EUA). This EUA will remain  in effect (meaning this test can be used) for the duration of the COVID-19 declaration under Section 564(b)(1) of the Act, 21 U.S.C.section 360bbb-3(b)(1), unless the authorization is terminated  or revoked sooner.       Influenza A by PCR NEGATIVE NEGATIVE Final   Influenza B by PCR NEGATIVE NEGATIVE Final    Comment: (NOTE) The Xpert Xpress SARS-CoV-2/FLU/RSV plus assay is intended as an aid in the diagnosis of influenza from Nasopharyngeal swab specimens and should not be used as a sole basis for treatment. Nasal washings and aspirates are unacceptable for Xpert Xpress SARS-CoV-2/FLU/RSV testing.  Fact Sheet for Patients: EntrepreneurPulse.com.au  Fact Sheet for Healthcare Providers: IncredibleEmployment.be  This test is not yet approved or cleared by the [place] FDA and has been authorized for detection and/or diagnosis of SARS-CoV-2 by FDA under an Emergency Use Authorization (EUA). This EUA will remain in effect (meaning this test can be used) for the duration of the COVID-19 declaration under Section 564(b)(1) of the Act, 21 U.S.C. section 360bbb-3(b)(1), unless the authorization is terminated or revoked.  Performed at [facility], [place] [address], [place], [place] [address]   MRSA Next Gen by PCR, Nasal     Status: None   Collection Time: 06/09/21  9:48 PM   Specimen: Nasal Mucosa; Nasal Swab  Result  Value Ref Range Status   MRSA by PCR Next Gen NOT DETECTED NOT DETECTED Final    Comment: (NOTE) The GeneXpert MRSA Assay (FDA approved for NASAL specimens only), is one component of a comprehensive MRSA colonization surveillance program. It is not intended to diagnose MRSA infection nor to guide  or monitor treatment for MRSA infections. Test performance is not FDA approved in patients less than 22 years old. Performed at [facility], [place] [address] [place], [place] [address]        Radiology Studies: No results found.  Scheduled Meds:  allopurinol  100 mg Oral Daily   amiodarone  400 mg Oral BID   apixaban  5 mg Oral BID   atorvastatin  40 mg Oral Daily   digoxin  0.125 mg Oral Daily   influenza vaccine adjuvanted  0.5 mL Intramuscular Tomorrow-1000   metoprolol succinate  25 mg Oral Once   metoprolol succinate  75 mg Oral Daily   predniSONE  40 mg Oral Q breakfast   sodium chloride flush  3 mL Intravenous Q12H   Continuous Infusions:  sodium chloride       LOS: 6 days   Time spent: 30 minutes   Darliss Cheney, MD [facility]  06/14/2021, 1:40 PM  Please page via [place] and do not message via secure chat for anything urgent. Secure chat can be used for anything non urgent.  How to contact the [facility] Attending or Consulting provider [place] or covering provider during after hours [place], for this patient?  Check the care team in [facility] and look for a) attending/consulting [facility] provider listed and b) the [facility] team listed. Page or secure chat [phone]. Log into [email or web address] and use [place] universal password to access. If you do not have the password, please contact the hospital operator. Locate the [facility] provider you are looking for under [facility] and page to a number that you can be directly reached. If you still have difficulty reaching the provider, please page the [facility] (Director on Call) for the Hospitalists listed on amion for assistance.

## 2021-06-14 NOTE — Progress Notes (Addendum)
Progress Note  Patient Name: Brian Weeks Date of Encounter: 06/14/2021  [facility] Cardiologist: Dr Burt Knack  Subjective   Pt denies CP or dyspnea  Inpatient Medications    Scheduled Meds:  allopurinol  100 mg Oral Daily   amiodarone  400 mg Oral BID   apixaban  5 mg Oral BID   atorvastatin  40 mg Oral Daily   digoxin  0.125 mg Oral Daily   influenza vaccine adjuvanted  0.5 mL Intramuscular Tomorrow-1000   metoprolol succinate  50 mg Oral Daily   predniSONE  40 mg Oral Q breakfast   sodium chloride flush  3 mL Intravenous Q12H   Continuous Infusions:  sodium chloride     PRN Meds: sodium chloride, acetaminophen **OR** acetaminophen, sodium chloride flush, zolpidem   Vital Signs    Vitals:   06/13/21 1505 06/13/21 1951 06/13/21 2226 06/14/21 0731  BP: 108/82 113/75 (!) 113/91 109/74  Pulse: 95 (!) 105 99 (!) 109  Resp: 20 16 20 20   Temp: 97.6 F (36.4 C) 98.2 F (36.8 C) 98.1 F (36.7 C) 97.6 F (36.4 C)  TempSrc: Oral Oral Oral Oral  SpO2: 96% 97% 98% 90%  Weight:      Height:        Intake/Output Summary (Last 24 hours) at 06/14/2021 0849 Last data filed at 06/14/2021 0800 Gross per 24 hour  Intake 720 ml  Output 900 ml  Net -180 ml    Last 3 Weights 06/10/2021 06/09/2021 06/08/2021  Weight (lbs) 218 lb 218 lb 7.6 oz 227 lb  Weight (kg) 98.884 kg 99.1 kg 102.967 kg      Telemetry    Atrial fibrillation rate mildly elevated- Personally Reviewed   Physical Exam   GEN: WD WN No acute distress.   Neck: supple Cardiac: irregular, no murmur Respiratory: CTA GI: Soft, NTND MS: No edema Neuro:  Grossly intact Psych: Normal affect   Labs    High Sensitivity Troponin:   Recent Labs  Lab 06/08/21 2045 06/08/21 2137 06/09/21 0034 06/09/21 0152  TROPONINIHS 16 15 13 17       Chemistry Recent Labs  Lab 06/08/21 2045 06/09/21 0152 06/10/21 0832 06/11/21 0040 06/12/21 0024 06/13/21 0021 06/14/21 0146  NA 140   < > 139   <  > 131* 134* 129*  K 4.4   < > 3.3*   < > 4.6 4.3 4.8  CL 109   < > 104   < > 98 100 99  CO2 21*   < > 26   < > 23 24 21*  GLUCOSE 107*   < > 102*   < > 114* 118* 108*  BUN 29*   < > 27*   < > 30* 38* 57*  CREATININE 1.53*   < > 1.82*   < > 1.77* 1.68* 1.99*  CALCIUM 8.8*   < > 8.7*   < > 8.7* 8.5* 8.3*  MG 2.1  --  2.0  --   --   --   --   PROT 7.2  --   --   --   --   --   --   ALBUMIN 3.1*  --   --   --   --   --   --   AST 21  --   --   --   --   --   --   ALT 18  --   --   --   --   --   --  ALKPHOS 110  --   --   --   --   --   --   BILITOT 1.1  --   --   --   --   --   --   GFRNONAA 49*   < > 39*   < > 41* 43* 35*  ANIONGAP 10   < > 9   < > 10 10 9    < > = values in this interval not displayed.     Lipids  Recent Labs  Lab 06/09/21 0152  CHOL 111  TRIG 40  HDL 32*  LDLCALC 71  CHOLHDL 3.5     Hematology Recent Labs  Lab 06/08/21 2045 06/09/21 0152  WBC 7.4 7.6  RBC 4.53 4.87  HGB 12.1* 13.0  HCT 39.9 42.7  MCV 88.1 87.7  MCH 26.7 26.7  MCHC 30.3 30.4  RDW 15.8* 15.7*  PLT 205 206    Thyroid  Recent Labs  Lab 06/08/21 2045  TSH 3.281     BNP Recent Labs  Lab 06/08/21 2045  BNP 1,105.3*     Patient Profile     70 y.o. male admitted with acute systolic congestive heart failure, cardiomyopathy and new onset atrial fibrillation.  Echocardiogram showed ejection fraction 20 to 25%, mild left ventricular enlargement, moderate RV dysfunction, moderate pulmonary hypertension, mild biatrial enlargement, mild to moderate mitral regurgitation, moderate tricuspid regurgitation.  Transesophageal echocardiogram suggested left atrial appendage thrombus.  Assessment & Plan    1 acute systolic congestive heart failure-euvolemic on exam.  Continue to hold diuretics.  2 cardiomyopathy-etiology unclear.  Potentially tachycardia mediated related to atrial fibrillation.  We will increase Toprol to 75 mg daily.  His blood pressure will not allow an ARB at this  point but will consider in the future.  Continue digoxin.  Will need repeat echocardiogram once sinus rhythm has been reestablished and if LV function remains decreased would need ischemia evaluation.  3 persistent atrial fibrillation-heart rate remains mildly elevated.  Continue amiodarone 400 mg twice daily for 1 week then decrease to 200 mg daily thereafter.  Continue digoxin.  Given baseline renal insufficiency and potential amiodarone interaction would check digoxin level prior to a.m. dose 3 to 5 days following discharge.  Increase Toprol to 75 mg daily.  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  Plan will be to continue apixaban for 3 to 4 weeks and then cardiovert.  4 chronic stage IIIa kidney disease-renal function slightly worse today.  Will need follow-up as an outpatient.  Patient request discharge.  Will ambulate today and follow heart rate/blood pressure.  If stable he can be discharged later today and we will arrange follow-up in atrial fibrillation clinic.  Plan cardioversion as outlined above in 3 to 4 weeks.  Likely needs TEE guided cardioversion as there was a question of left atrial appendage thrombus on recent TEE.    For questions or updates, please contact [place] Please consult [email or web address] for contact info under        Signed, Kirk Ruths, MD  06/14/2021, 8:49 AM

## 2021-06-14 NOTE — Progress Notes (Signed)
Pt refused to take extra 25 mg metoprolol which he agreed to take earlier. Pt said "he felt like drugged up" and does not want to take anymore pill, Informed to MD Darliss Cheney  Pt's wife is in room and trying to make him calm and relax. While pt walked earlier for the second time he was still A-fib/tachy up to 120's and SOB/ distress, will continue to monitor the patient  Palma Holter, RN

## 2021-06-15 ENCOUNTER — Other Ambulatory Visit (HOSPITAL_COMMUNITY): Payer: Self-pay

## 2021-06-15 LAB — DIGOXIN LEVEL: Digoxin Level: 0.5 ng/mL — ABNORMAL LOW (ref 0.8–2.0)

## 2021-06-15 LAB — BASIC METABOLIC PANEL
Anion gap: 13 (ref 5–15)
BUN: 84 mg/dL — ABNORMAL HIGH (ref 8–23)
CO2: 20 mmol/L — ABNORMAL LOW (ref 22–32)
Calcium: 8.3 mg/dL — ABNORMAL LOW (ref 8.9–10.3)
Chloride: 95 mmol/L — ABNORMAL LOW (ref 98–111)
Creatinine, Ser: 3.35 mg/dL — ABNORMAL HIGH (ref 0.61–1.24)
GFR, Estimated: 19 mL/min — ABNORMAL LOW (ref >60)
Glucose, Bld: 104 mg/dL — ABNORMAL HIGH (ref 70–99)
Potassium: 4.8 mmol/L (ref 3.5–5.1)
Sodium: 128 mmol/L — ABNORMAL LOW (ref 135–145)

## 2021-06-15 MED ORDER — METOPROLOL SUCCINATE ER 25 MG PO TB24
75.0000 mg | ORAL_TABLET | Freq: Every day | ORAL | 0 refills | Status: DC
  Filled 2021-06-15: qty 90, 30d supply, fill #0

## 2021-06-15 MED ORDER — DIGOXIN 125 MCG PO TABS
0.1250 mg | ORAL_TABLET | Freq: Every day | ORAL | 0 refills | Status: DC
  Filled 2021-06-15: qty 30, 30d supply, fill #0

## 2021-06-15 MED ORDER — APIXABAN 5 MG PO TABS
5.0000 mg | ORAL_TABLET | Freq: Two times a day (BID) | ORAL | 0 refills | Status: DC
  Filled 2021-06-15: qty 60, 30d supply, fill #0

## 2021-06-15 MED ORDER — AMIODARONE HCL 200 MG PO TABS
400.0000 mg | ORAL_TABLET | Freq: Two times a day (BID) | ORAL | 0 refills | Status: DC
  Filled 2021-06-15: qty 24, 6d supply, fill #0

## 2021-06-15 NOTE — TOC Transition Note (Signed)
Transition of Care (TOC) - CM/SW Discharge Note   Patient Details  Name: Brian Weeks MRN: [id number] Date of Birth: 01/02/1951  Transition of Care (TOC) CM/SW Contact:  Taylor, Deborah Clinton, RN Phone Number: 06/15/2021, 9:36 AM   Clinical Narrative:    Patient is for dc today, NCM spoke to him regarding his eliquis co pay amt 126.23 until his deductible has been met.  TOC will fill the first 30 days free for him here with the 30 day coupon.  NCM informed patient if the co pay will be to much for him to let his doctor know on his follow up apt so that he can be switched to sometjhing else.    Final next level of care: Home/Self Care Barriers to Discharge: No Barriers Identified   Patient Goals and CMS Choice Patient states their goals for this hospitalization and ongoing recovery are:: return home   Choice offered to / list presented to : NA  Discharge Placement                       Discharge Plan and Services                  DME Agency: NA       HH Arranged: NA          Social Determinants of Health (SDOH) Interventions     Readmission Risk Interventions No flowsheet data found.     

## 2021-06-15 NOTE — Discharge Summary (Signed)
Physician Discharge Summary  Ramin Zoll Atkerson BTD:[id number] DOB: 12/21/1950 DOA: 06/08/2021  PCP: Kristen Loader, FNP  Admit date: 06/08/2021 Discharge date: 06/15/2021 30 Day Unplanned Readmission Risk Score    Flowsheet Row ED to Hosp-Admission (Current) from 06/08/2021 in [facility]  30 Day Unplanned Readmission Risk Score (%) 13.68 Filed at 06/15/2021 0801       This score is the patient's risk of an unplanned readmission within 30 days of being discharged (0 -100%). The score is based on dignosis, age, lab data, medications, orders, and past utilization.   Low:  0-14.9   Medium: 15-21.9   High: 22-29.9   Extreme: 30 and above          Admitted From: Home Disposition: Home  Recommendations for Outpatient Follow-up:  Follow up with PCP in 1-2 weeks Please obtain BMP/CBC in one week Follow-up with your cardiologist as soon as possible/he tells me he has appointment scheduled with Dr. Einar Gip tomorrow. Please follow up with your PCP on the following pending results: Unresulted Labs (From admission, onward)     Start     Ordered   06/15/21 1062  Basic metabolic panel  Once,   R       Question:  Specimen collection method  Answer:  Lab=Lab collect   06/15/21 0829              Home Health: None Equipment/Devices: None  Discharge Condition: Stable CODE STATUS: Full code Diet recommendation: Cardiac  Subjective: Seen and examined.  He has no complaints.  He is once again eager to go home and demands discharge.  Brief/Interim Summary: Brian Weeks is a 70 y.o. male with a PMH significant for gout, HLD. He presented from home to the ED on 06/08/2021 with exertional shortness of breath several weeks in the ED, it was found that he had new onset A. fib RVR.  Patient was treated with Cardizem drip and Eliquis.  Patient was admitted to medicine service for further workup and management of new onset A. Fib, cardiology was consulted. Echo shows  20 to 25% ejection fraction with left ventricular global hypokinesis.  Due to low EF, Cardizem was stopped and he was transitioned to amiodarone.  Subsequently was transitioned to oral amiodarone, digoxin and Toprol-XL was added.  His heart rate were better controlled but still out of range, Toprol was gradually increased to 75 mg p.o. daily.  Despite of this, patient's heart rate jumped to 125 with activity yesterday.  Patient has been extremely anxious and was demanding to send him home yesterday.  Despite of extreme counseling, he remained stable and until he was counseled by his wife and then he was calm and decided to wait overnight.  We asked him to take another extra dose of 25 mg of Toprol-XL, he was initially agreed to that but after order was placed, he refused and accused the staff that he was being drugged up.  He is feeling well this morning, at rest his heart rate is 90.  Patient is not willing to listen anything and he wants to go home even if his heart rate goes uncontrolled with activity.  I discussed with cardiologist Dr. Harrell Gave, she stated that patient can go home from cardiology standpoint only because he is now willing to comply to recommendations.  Patient tells me he has follow-up appointment with Dr. Stanford Breed tomorrow morning.  I have prescribed him 6 more days of amiodarone 400 mg p.o. twice daily, cardiology  to prescribe him further amiodarone 200 mg p.o. twice daily after that.   Acute systolic congestive heart failure: Echo results as above.  Received IV Lasix 3 days ago, blood pressure low, not allowing further IV Lasix.  No shortness of breath, no crackles on exam.  Currently on 1 L of oxygen, likely due to comfort.  We will check ambulatory oximetry, if needed, will send home on oxygen.   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  His creatinine has remained stable around 1.6 since admission.  No prior results available in the chart for [place] to compare.  I do not think he has  AKI.   Hypokalemia: Resolved.   Left wrist gout: This was treated with resuming his allopurinol and prednisone since he was allergic to colchicine.  No more pain or tenderness or swelling on the wrist.  Discharge Diagnoses:  Principal Problem:   Atrial fibrillation with RVR ([place]) Active Problems:   Acute CHF (congestive heart failure) (HCC)   AKI (acute kidney injury) ([place])   Prolonged QT interval    Discharge Instructions   Allergies as of 06/15/2021       Reactions   Colchicine    Sweating        Medication List     TAKE these medications    allopurinol 100 MG tablet Commonly known as: ZYLOPRIM Take 100 mg by mouth daily.   amiodarone 400 MG tablet Commonly known as: PACERONE Take 1 tablet (400 mg total) by mouth 2 (two) times daily for 6 days.   apixaban 5 MG Tabs tablet Commonly known as: ELIQUIS Take 1 tablet (5 mg total) by mouth 2 (two) times daily.   atorvastatin 10 MG tablet Commonly known as: LIPITOR Take 10 mg by mouth daily.   digoxin 0.125 MG tablet Commonly known as: LANOXIN Take 1 tablet (0.125 mg total) by mouth daily.   metoprolol succinate 25 MG 24 hr tablet Commonly known as: TOPROL-XL Take 3 tablets (75 mg total) by mouth daily.        Follow-up Information     [facility] Follow up.   Specialty: Cardiology Why: office will call with appointment Contact information: [address] [phone] [place]        Imogene Burn, PA-C Follow up on 07/01/2021.   Specialty: Cardiology Why: Please arrive 15 minutes early for your post-hospital cardiology appointment Contact information: [address] [place] [address] [phone]         Kristen Loader, FNP Follow up in 1 week(s).   Specialty: Family Medicine Contact information: [place] [address] [phone]                Allergies   Allergen Reactions   Colchicine     Sweating    Consultations: Cardiology   Procedures/Studies: DG CHEST PORT 1 VIEW  Result Date: 06/08/2021 CLINICAL DATA:  Shortness of breath EXAM: PORTABLE CHEST 1 VIEW COMPARISON:  None FINDINGS: Cardiomegaly. Diffuse bilateral interstitial opacity could be due to interstitial inflammatory process or minimal edema. No pleural effusion. No pneumothorax IMPRESSION: Cardiomegaly with diffuse interstitial opacity which may be due to mild edema or interstitial inflammatory process. Electronically Signed   By: Donavan Foil M.D.   On: 06/08/2021 22:21   DG Foot Complete Right  Result Date: 06/01/2021 CLINICAL DATA:  Metatarsal pain, no injury, gout. EXAM: RIGHT FOOT COMPLETE - 3+ VIEW COMPARISON:  None.  FINDINGS: No acute osseous or joint abnormality. No erosions with overhanging edges. There may be degenerative changes at the fourth proximal interphalangeal joint versus previous trauma. Very minimal degenerative changes at the first metatarsophalangeal joint. IMPRESSION: No findings to explain the patient's metatarsal pain. Electronically Signed   By: Lorin Picket M.D.   On: 06/01/2021 11:00   ECHOCARDIOGRAM COMPLETE  Result Date: 06/09/2021    ECHOCARDIOGRAM REPORT   Patient Name:   WAQAS BRUHL [facility] Date of Exam: 06/09/2021 Medical Rec #:  [id number]          Height:       71.0 in Accession #:    [id number]         Weight:       227.0 lb Date of Birth:  1951-06-28          BSA:          2.225 m Patient Age:    71 years           BP:           118/100 mmHg Patient Gender: M                  HR:           108 bpm. Exam Location:  Inpatient Procedure: 2D Echo, Cardiac Doppler, Color Doppler and Intracardiac            Opacification Agent Indications:    Atrial Fibrillation  History:        Patient has no prior history of Echocardiogram examinations.                 Arrythmias:Atrial Fibrillation and Tachycardia.  Sonographer:    Merrie Roof RDCS Referring  Phys: [id number] [place]  1. Left ventricular ejection fraction, by estimation, is 20 to 25%. The left ventricle has severely decreased function. The left ventricle demonstrates global hypokinesis. The left ventricular internal cavity size was mildly dilated. Left ventricular diastolic function could not be evaluated.  2. Right ventricular systolic function is moderately reduced. The right ventricular size is normal. There is moderately elevated pulmonary artery systolic pressure. The estimated right ventricular systolic pressure is 96.2 mmHg.  3. Left atrial size was mildly dilated.  4. Right atrial size was mildly dilated.  5. The mitral valve is grossly normal. Mild to moderate mitral valve regurgitation. No evidence of mitral stenosis.  6. Tricuspid valve regurgitation is moderate.  7. The aortic valve is tricuspid. Aortic valve regurgitation is not visualized. No aortic stenosis is present.  8. The inferior vena cava is dilated in size with <50% respiratory variability, suggesting right atrial pressure of 15 mmHg. FINDINGS  Left Ventricle: Left ventricular ejection fraction, by estimation, is 20 to 25%. The left ventricle has severely decreased function. The left ventricle demonstrates global hypokinesis. Definity contrast agent was given IV to delineate the left ventricular endocardial borders. The left ventricular internal cavity size was mildly dilated. There is no left ventricular hypertrophy. Left ventricular diastolic function could not be evaluated due to atrial fibrillation. Left ventricular diastolic function could not be evaluated. Right Ventricle: The right ventricular size is normal. No increase in right ventricular wall thickness. Right ventricular systolic function is moderately reduced. There is moderately elevated pulmonary artery systolic pressure. The tricuspid regurgitant velocity is 2.86 m/s, and with an assumed right atrial pressure of 15 mmHg, the estimated right  ventricular systolic pressure is 95.2 mmHg. Left Atrium: Left atrial size was mildly dilated. Right  Atrium: Right atrial size was mildly dilated. Pericardium: There is no evidence of pericardial effusion. Mitral Valve: The mitral valve is grossly normal. Mild to moderate mitral valve regurgitation. No evidence of mitral valve stenosis. Tricuspid Valve: The tricuspid valve is grossly normal. Tricuspid valve regurgitation is moderate . No evidence of tricuspid stenosis. Aortic Valve: The aortic valve is tricuspid. Aortic valve regurgitation is not visualized. No aortic stenosis is present. Aortic valve mean gradient measures 2.0 mmHg. Aortic valve peak gradient measures 3.7 mmHg. Aortic valve area, by VTI measures 1.48 cm. Pulmonic Valve: The pulmonic valve was grossly normal. Pulmonic valve regurgitation is not visualized. No evidence of pulmonic stenosis. Aorta: The aortic root and ascending aorta are structurally normal, with no evidence of dilitation. Venous: The inferior vena cava is dilated in size with less than 50% respiratory variability, suggesting right atrial pressure of 15 mmHg. IAS/Shunts: The atrial septum is grossly normal.  LEFT VENTRICLE PLAX 2D LVIDd:         5.80 cm LVIDs:         5.20 cm LV PW:         0.97 cm LV IVS:        0.83 cm LVOT diam:     2.10 cm LV SV:         25 LV SV Index:   11 LVOT Area:     3.46 cm  RIGHT VENTRICLE             IVC RV Basal diam:  3.80 cm     IVC diam: 2.40 cm RV S prime:     12.70 cm/s TAPSE (M-mode): 1.6 cm LEFT ATRIUM              Index        RIGHT ATRIUM           Index LA diam:        4.70 cm  2.11 cm/m   RA Area:     29.10 cm LA Vol (A2C):   105.0 ml 47.19 ml/m  RA Volume:   114.00 ml 51.23 ml/m LA Vol (A4C):   64.4 ml  28.94 ml/m LA Biplane Vol: 81.8 ml  36.76 ml/m  AORTIC VALVE AV Area (Vmax):    2.03 cm AV Area (Vmean):   1.79 cm AV Area (VTI):     1.48 cm AV Vmax:           96.50 cm/s AV Vmean:          73.000 cm/s AV VTI:            0.172 m AV  Peak Grad:      3.7 mmHg AV Mean Grad:      2.0 mmHg LVOT Vmax:         56.60 cm/s LVOT Vmean:        37.700 cm/s LVOT VTI:          0.074 m LVOT/AV VTI ratio: 0.43  AORTA Ao Root diam: 3.05 cm Ao Asc diam:  3.10 cm TRICUSPID VALVE TR Peak grad:   32.7 mmHg TR Vmax:        286.00 cm/s  SHUNTS Systemic VTI:  0.07 m Systemic Diam: 2.10 cm Eleonore Chiquito MD Electronically signed by Eleonore Chiquito MD Signature Date/Time: 06/09/2021/1:24:43 PM    Final    ECHO TEE  Result Date: 06/10/2021    TRANSESOPHOGEAL ECHO REPORT   Patient Name:   CHINEDU AGUSTIN [facility] Date of Exam: 06/10/2021 Medical Rec #:  [id number]          Height:       71.0 in Accession #:    [id number]         Weight:       218.0 lb Date of Birth:  1951/07/16          BSA:          2.187 m Patient Age:    12 years           BP:           94/63 mmHg Patient Gender: M                  HR:           127 bpm. Exam Location:  Inpatient Procedure: 3D Echo, Transesophageal Echo, Cardiac Doppler and Color Doppler Indications:     Atrial Fibrilation  History:         Patient has prior history of Echocardiogram examinations, most                  recent 06/09/2021.  Sonographer:     Philipp Deputy RDCS Referring Phys:  [id number] [facility] B ROBERTS Diagnosing Phys: Rudean Haskell MD PROCEDURE: After discussion of the risks and benefits of a TEE, an informed consent was obtained from the patient. The transesophogeal probe was passed without difficulty through the esophogus of the patient. Imaged were obtained with the patient in a left lateral decubitus position. Sedation performed by different physician. The patient was monitored while under deep sedation. Anesthestetic sedation was provided intravenously by Anesthesiology: 203mg  of Propofol. Image quality was adequate. The patient developed no complications during the procedure. IMPRESSIONS  1. There is linear echodensity in the left atrial appendage with heavy smoke and stalk attachment. Contrast study done;  though not classical appearnce in the clinical syndrome of atrial fibrillation did not proceed to cardioversion.  2. Left ventricular ejection fraction, by estimation, is 20 to 25%. The left ventricle has severely decreased function. The left ventricle demonstrates global hypokinesis.  3. Right ventricular systolic function is normal. The right ventricular size is normal.  4. Left atrial size was moderately dilated. A left atrial/left atrial appendage thrombus was detected.  5. The mitral valve is normal in structure. Mild to moderate mitral valve regurgitation. No evidence of mitral stenosis.  6. The aortic valve is tricuspid. Aortic valve regurgitation is not visualized. FINDINGS  Left Ventricle: Left ventricular ejection fraction, by estimation, is 20 to 25%. The left ventricle has severely decreased function. The left ventricle demonstrates global hypokinesis. Definity contrast agent was given IV to delineate the left ventricular endocardial borders. The left ventricular internal cavity size was normal in size. Right Ventricle: The right ventricular size is normal. No increase in right ventricular wall thickness. Right ventricular systolic function is normal. Left Atrium: Left atrial size was moderately dilated. A left atrial/left atrial appendage thrombus was detected. Right Atrium: Right atrial size was normal in size. Pericardium: There is no evidence of pericardial effusion. Mitral Valve: The mitral valve is normal in structure. Mild to moderate mitral valve regurgitation. No evidence of mitral valve stenosis. Tricuspid Valve: The tricuspid valve is normal in structure. Tricuspid valve regurgitation is mild. Aortic Valve: The aortic valve is tricuspid. Aortic valve regurgitation is not visualized. Pulmonic Valve: The pulmonic valve was normal in structure. Pulmonic valve regurgitation is not visualized. Aorta: The aortic root and ascending aorta are structurally normal, with no evidence of dilitation.  IAS/Shunts: The  atrial septum is grossly normal. Rudean Haskell MD Electronically signed by Rudean Haskell MD Signature Date/Time: 06/10/2021/11:54:59 AM    Final      Discharge Exam: Vitals:   06/15/21 0443 06/15/21 0731  BP: (!) 121/94 103/84  Pulse: 100 95  Resp: 16 20  Temp: 98 F (36.7 C)   SpO2: 98% 97%   Vitals:   06/14/21 1957 06/14/21 2359 06/15/21 0443 06/15/21 0731  BP: 109/83 96/86 (!) 121/94 103/84  Pulse: 94 91 100 95  Resp: 12 16 16 20   Temp: 98.1 F (36.7 C) 97.6 F (36.4 C) 98 F (36.7 C)   TempSrc: Oral Oral Oral   SpO2: 98% 96% 98% 97%  Weight:      Height:        General: Pt is alert, awake, not in acute distress Cardiovascular: Irregularly irregular rate and rhythm, S1/S2 +, no rubs, no gallops Respiratory: CTA bilaterally, no wheezing, no rhonchi Abdominal: Soft, NT, ND, bowel sounds + Extremities: no edema, no cyanosis    The results of significant diagnostics from this hospitalization (including imaging, microbiology, ancillary and laboratory) are listed below for reference.     Microbiology: Recent Results (from the past 240 hour(s))  Urine Culture     Status: None   Collection Time: 06/08/21  7:37 PM   Specimen: Urine, Clean Catch  Result Value Ref Range Status   Specimen Description URINE, CLEAN CATCH  Final   Special Requests NONE  Final   Culture   Final    NO GROWTH Performed at [facility], [address], [place], [place] [address]    Report Status 06/09/2021 FINAL  Final  Resp Panel by RT-PCR (Flu A&B, Covid) Nasopharyngeal Swab     Status: None   Collection Time: 06/08/21  8:36 PM   Specimen: Nasopharyngeal Swab; Nasopharyngeal(NP) swabs in vial transport medium  Result Value Ref Range Status   SARS Coronavirus 2 by RT PCR NEGATIVE NEGATIVE Final    Comment: (NOTE) SARS-CoV-2 target nucleic acids are NOT DETECTED.  The SARS-CoV-2 RNA is generally detectable in upper respiratory specimens during the acute  phase of infection. The lowest concentration of SARS-CoV-2 viral copies this assay can detect is 138 copies/mL. A negative result does not preclude SARS-Cov-2 infection and should not be used as the sole basis for treatment or other patient management decisions. A negative result may occur with  improper specimen collection/handling, submission of specimen other than nasopharyngeal swab, presence of viral mutation(s) within the areas targeted by this assay, and inadequate number of viral copies(<138 copies/mL). A negative result must be combined with clinical observations, patient history, and epidemiological information. The expected result is Negative.  Fact Sheet for Patients:  EntrepreneurPulse.com.au  Fact Sheet for Healthcare Providers:  IncredibleEmployment.be  This test is no t yet approved or cleared by the [place] FDA and  has been authorized for detection and/or diagnosis of SARS-CoV-2 by FDA under an Emergency Use Authorization (EUA). This EUA will remain  in effect (meaning this test can be used) for the duration of the COVID-19 declaration under Section 564(b)(1) of the Act, 21 U.S.C.section 360bbb-3(b)(1), unless the authorization is terminated  or revoked sooner.       Influenza A by PCR NEGATIVE NEGATIVE Final   Influenza B by PCR NEGATIVE NEGATIVE Final    Comment: (NOTE) The Xpert Xpress SARS-CoV-2/FLU/RSV plus assay is intended as an aid in the diagnosis of influenza from Nasopharyngeal swab specimens and should not be used as  a sole basis for treatment. Nasal washings and aspirates are unacceptable for Xpert Xpress SARS-CoV-2/FLU/RSV testing.  Fact Sheet for Patients: EntrepreneurPulse.com.au  Fact Sheet for Healthcare Providers: IncredibleEmployment.be  This test is not yet approved or cleared by the [place] FDA and has been authorized for detection and/or diagnosis of  SARS-CoV-2 by FDA under an Emergency Use Authorization (EUA). This EUA will remain in effect (meaning this test can be used) for the duration of the COVID-19 declaration under Section 564(b)(1) of the Act, 21 U.S.C. section 360bbb-3(b)(1), unless the authorization is terminated or revoked.  Performed at [facility], [place] [address] [place], [place] [address]   MRSA Next Gen by PCR, Nasal     Status: None   Collection Time: 06/09/21  9:48 PM   Specimen: Nasal Mucosa; Nasal Swab  Result Value Ref Range Status   MRSA by PCR Next Gen NOT DETECTED NOT DETECTED Final    Comment: (NOTE) The GeneXpert MRSA Assay (FDA approved for NASAL specimens only), is one component of a comprehensive MRSA colonization surveillance program. It is not intended to diagnose MRSA infection nor to guide or monitor treatment for MRSA infections. Test performance is not FDA approved in patients less than 37 years old. Performed at [facility], [place] [address] [place], [place] [address]      Labs: BNP (last 3 results) Recent Labs    06/08/21 2045  BNP 2,703.5*   Basic Metabolic Panel: Recent Labs  Lab 06/08/21 2045 06/09/21 0152 06/10/21 0832 06/11/21 0040 06/12/21 0024 06/13/21 0021 06/14/21 0146  NA 140   < > 139 136 131* 134* 129*  K 4.4   < > 3.3* 4.3 4.6 4.3 4.8  CL 109   < > 104 103 98 100 99  CO2 21*   < > 26 24 23 24  21*  GLUCOSE 107*   < > 102* 102* 114* 118* 108*  BUN 29*   < > 27* 30* 30* 38* 57*  CREATININE 1.53*   < > 1.82* 1.88* 1.77* 1.68* 1.99*  CALCIUM 8.8*   < > 8.7* 8.6* 8.7* 8.5* 8.3*  MG 2.1  --  2.0  --   --   --   --    < > = values in this interval not displayed.   Liver Function Tests: Recent Labs  Lab 06/08/21 2045  AST 21  ALT 18  ALKPHOS 110  BILITOT 1.1  PROT 7.2  ALBUMIN 3.1*   No results for input(s): LIPASE, AMYLASE in the last 168 hours. No results for input(s): AMMONIA in the last 168 hours. CBC: Recent Labs  Lab 06/08/21 2045  06/09/21 0152  WBC 7.4 7.6  NEUTROABS 5.0  --   HGB 12.1* 13.0  HCT 39.9 42.7  MCV 88.1 87.7  PLT 205 206   Cardiac Enzymes: No results for input(s): CKTOTAL, CKMB, CKMBINDEX, TROPONINI in the last 168 hours. BNP: Invalid input(s): POCBNP CBG: No results for input(s): GLUCAP in the last 168 hours. D-Dimer No results for input(s): DDIMER in the last 72 hours. Hgb A1c No results for input(s): HGBA1C in the last 72 hours. Lipid Profile No results for input(s): CHOL, HDL, LDLCALC, TRIG, CHOLHDL, LDLDIRECT in the last 72 hours. Thyroid function studies No results for input(s): TSH, T4TOTAL, T3FREE, THYROIDAB in the last 72 hours.  Invalid input(s): FREET3 Anemia work up No results for input(s): VITAMINB12, FOLATE, FERRITIN, TIBC, IRON, RETICCTPCT in the last 72 hours. Urinalysis    Component Value  Date/Time   COLORURINE YELLOW 06/08/2021 1937   APPEARANCEUR CLEAR 06/08/2021 1937   LABSPEC 1.014 06/08/2021 1937   PHURINE 5.0 06/08/2021 1937   GLUCOSEU NEGATIVE 06/08/2021 1937   HGBUR NEGATIVE 06/08/2021 [place] NEGATIVE 06/08/2021 [place] NEGATIVE 06/08/2021 1937   PROTEINUR NEGATIVE 06/08/2021 1937   NITRITE NEGATIVE 06/08/2021 1937   LEUKOCYTESUR NEGATIVE 06/08/2021 1937   Sepsis Labs Invalid input(s): PROCALCITONIN,  WBC,  LACTICIDVEN Microbiology Recent Results (from the past 240 hour(s))  Urine Culture     Status: None   Collection Time: 06/08/21  7:37 PM   Specimen: Urine, Clean Catch  Result Value Ref Range Status   Specimen Description URINE, CLEAN CATCH  Final   Special Requests NONE  Final   Culture   Final    NO GROWTH Performed at [facility], [place] [address], [place], [place] [address]    Report Status 06/09/2021 FINAL  Final  Resp Panel by RT-PCR (Flu A&B, Covid) Nasopharyngeal Swab     Status: None   Collection Time: 06/08/21  8:36 PM   Specimen: Nasopharyngeal Swab; Nasopharyngeal(NP) swabs in vial transport medium   Result Value Ref Range Status   SARS Coronavirus 2 by RT PCR NEGATIVE NEGATIVE Final    Comment: (NOTE) SARS-CoV-2 target nucleic acids are NOT DETECTED.  The SARS-CoV-2 RNA is generally detectable in upper respiratory specimens during the acute phase of infection. The lowest concentration of SARS-CoV-2 viral copies this assay can detect is 138 copies/mL. A negative result does not preclude SARS-Cov-2 infection and should not be used as the sole basis for treatment or other patient management decisions. A negative result may occur with  improper specimen collection/handling, submission of specimen other than nasopharyngeal swab, presence of viral mutation(s) within the areas targeted by this assay, and inadequate number of viral copies(<138 copies/mL). A negative result must be combined with clinical observations, patient history, and epidemiological information. The expected result is Negative.  Fact Sheet for Patients:  EntrepreneurPulse.com.au  Fact Sheet for Healthcare Providers:  IncredibleEmployment.be  This test is no t yet approved or cleared by the [place] FDA and  has been authorized for detection and/or diagnosis of SARS-CoV-2 by FDA under an Emergency Use Authorization (EUA). This EUA will remain  in effect (meaning this test can be used) for the duration of the COVID-19 declaration under Section 564(b)(1) of the Act, 21 U.S.C.section 360bbb-3(b)(1), unless the authorization is terminated  or revoked sooner.       Influenza A by PCR NEGATIVE NEGATIVE Final   Influenza B by PCR NEGATIVE NEGATIVE Final    Comment: (NOTE) The Xpert Xpress SARS-CoV-2/FLU/RSV plus assay is intended as an aid in the diagnosis of influenza from Nasopharyngeal swab specimens and should not be used as a sole basis for treatment. Nasal washings and aspirates are unacceptable for Xpert Xpress SARS-CoV-2/FLU/RSV testing.  Fact Sheet for  Patients: EntrepreneurPulse.com.au  Fact Sheet for Healthcare Providers: IncredibleEmployment.be  This test is not yet approved or cleared by the [place] FDA and has been authorized for detection and/or diagnosis of SARS-CoV-2 by FDA under an Emergency Use Authorization (EUA). This EUA will remain in effect (meaning this test can be used) for the duration of the COVID-19 declaration under Section 564(b)(1) of the Act, 21 U.S.C. section 360bbb-3(b)(1), unless the authorization is terminated or revoked.  Performed at [facility], [place] [address], [place], [place] [address]   MRSA Next Gen by PCR, Nasal  Status: None   Collection Time: 06/09/21  9:48 PM   Specimen: Nasal Mucosa; Nasal Swab  Result Value Ref Range Status   MRSA by PCR Next Gen NOT DETECTED NOT DETECTED Final    Comment: (NOTE) The GeneXpert MRSA Assay (FDA approved for NASAL specimens only), is one component of a comprehensive MRSA colonization surveillance program. It is not intended to diagnose MRSA infection nor to guide or monitor treatment for MRSA infections. Test performance is not FDA approved in patients less than 76 years old. Performed at [facility], [place] [address] [place], [place] [address]      Time coordinating discharge: Over 30 minutes  SIGNED:   Darliss Cheney, MD  [facility] 06/15/2021, 9:10 AM  If 7PM-7AM, please contact night-coverage [email or web address]

## 2021-06-15 NOTE — Progress Notes (Signed)
Occupational Therapy Treatment Patient Details Name: Brian Weeks MRN: [id number] DOB: 1950-09-13 Today's Date: 06/15/2021   History of present illness 70 y.o. male with a PMH significant for gout, HLD.  He presented with exertional shortness of breath several weeks, he was found that he had A. fib RVR.   OT comments  Pt making good progress with all adls and goals meeting goals 1-2.  Pt continues to be limited most by SOB during physical activity but is keeping O2 sats up above 90% with all adl tasks and ambulation in hall on RA.  Spoke with pt about energy conservation techniques that can be implemented at home.  Pt is safe for d/c from OT standpoint.    Recommendations for follow up therapy are one component of a multi-disciplinary discharge planning process, led by the attending physician.  Recommendations may be updated based on patient status, additional functional criteria and insurance authorization.    Follow Up Recommendations  No OT follow up    Assistance Recommended at Discharge PRN  Equipment Recommendations  None recommended by OT    Recommendations for Other Services      Precautions / Restrictions Precautions Precautions: Other (comment) Precaution Comments: Watch HR and O2 sats Restrictions Weight Bearing Restrictions: No       Mobility Bed Mobility Overal bed mobility: Modified Independent             General bed mobility comments: OOB on arrival    Transfers Overall transfer level: Modified independent                 General transfer comment: No physical assist needed     Balance Overall balance assessment: Mild deficits observed, not formally tested                                         ADL either performed or assessed with clinical judgement   ADL Overall ADL's : Modified independent                                       General ADL Comments: Pt toileted in room and groomed at sink.  Reviewed pursed lip breathing. Pt kept O2 sats above 92% on RA for entire session.  Pt did have some SOB with activity but recovered just by standing and taking a break.     Vision   Vision Assessment?: No apparent visual deficits   Perception Perception Perception: Within Functional Limits   Praxis Praxis Praxis: Intact    Cognition Arousal/Alertness: Awake/alert Behavior During Therapy: WFL for tasks assessed/performed Overall Cognitive Status: Within Functional Limits for tasks assessed                                 General Comments: anxious and wanting to go home          Exercises     Shoulder Instructions       General Comments Pt most limited by SOB. Talked to at length about things he could do at home to save energy and attempt to control SOB.    Pertinent Vitals/ Pain       Pain Assessment: No/denies pain  Home Living  Prior Functioning/Environment              Frequency  Min 1X/week        Progress Toward Goals  OT Goals(current goals can now be found in the care plan section)  Progress towards OT goals: Progressing toward goals  Acute Rehab OT Goals Patient Stated Goal: to go home today OT Goal Formulation: With patient Time For Goal Achievement: 06/25/21 Potential to Achieve Goals: Good ADL Goals Pt Will Perform Lower Body Bathing: Independently;sit to/from stand Pt Will Perform Lower Body Dressing: Independently Pt Will Transfer to Toilet: Independently;regular height toilet;ambulating  Plan Discharge plan remains appropriate    Co-evaluation                 AM-PAC OT "6 Clicks" Daily Activity     Outcome Measure   Help from another person eating meals?: None Help from another person taking care of personal grooming?: None Help from another person toileting, which includes using toliet, bedpan, or urinal?: None Help from another person bathing  (including washing, rinsing, drying)?: None Help from another person to put on and taking off regular upper body clothing?: None Help from another person to put on and taking off regular lower body clothing?: None 6 Click Score: 24    End of Session    OT Visit Diagnosis: Unsteadiness on feet (R26.81)   Activity Tolerance Patient tolerated treatment well   Patient Left in chair;with call bell/phone within reach   Nurse Communication Mobility status        Time: 0093-8182 OT Time Calculation (min): 17 min  Charges: OT General Charges $OT Visit: 1 Visit OT Treatments $Self Care/Home Management : 8-22 mins    Glenford Peers 06/15/2021, 10:34 AM

## 2021-06-16 ENCOUNTER — Ambulatory Visit (HOSPITAL_BASED_OUTPATIENT_CLINIC_OR_DEPARTMENT_OTHER)
Admission: RE | Admit: 2021-06-16 | Discharge: 2021-06-16 | Disposition: A | Discharge status: Home / Self Care | Payer: Medicare Other | Source: Ambulatory Visit | Attending: Physician Assistant | Admitting: Physician Assistant

## 2021-06-16 ENCOUNTER — Other Ambulatory Visit: Payer: Self-pay

## 2021-06-16 ENCOUNTER — Encounter (HOSPITAL_COMMUNITY): Payer: Self-pay | Admitting: Physician Assistant

## 2021-06-16 VITALS — BP 118/80 | HR 62 | Ht 71.0 in | Wt 231.0 lb

## 2021-06-16 DIAGNOSIS — I5020 Unspecified systolic (congestive) heart failure: Secondary | ICD-10-CM | POA: Insufficient documentation

## 2021-06-16 DIAGNOSIS — I4819 Other persistent atrial fibrillation: Secondary | ICD-10-CM | POA: Diagnosis not present

## 2021-06-16 DIAGNOSIS — N1831 Chronic kidney disease, stage 3a: Secondary | ICD-10-CM | POA: Diagnosis not present

## 2021-06-16 DIAGNOSIS — Z20822 Contact with and (suspected) exposure to covid-19: Secondary | ICD-10-CM | POA: Diagnosis not present

## 2021-06-16 DIAGNOSIS — I130 Hypertensive heart and chronic kidney disease with heart failure and stage 1 through stage 4 chronic kidney disease, or unspecified chronic kidney disease: Secondary | ICD-10-CM | POA: Diagnosis not present

## 2021-06-16 DIAGNOSIS — N189 Chronic kidney disease, unspecified: Secondary | ICD-10-CM | POA: Diagnosis not present

## 2021-06-16 DIAGNOSIS — D649 Anemia, unspecified: Secondary | ICD-10-CM | POA: Diagnosis not present

## 2021-06-16 DIAGNOSIS — R7303 Prediabetes: Secondary | ICD-10-CM | POA: Diagnosis not present

## 2021-06-16 DIAGNOSIS — N19 Unspecified kidney failure: Secondary | ICD-10-CM | POA: Diagnosis not present

## 2021-06-16 DIAGNOSIS — I509 Heart failure, unspecified: Secondary | ICD-10-CM | POA: Diagnosis not present

## 2021-06-16 DIAGNOSIS — N1832 Chronic kidney disease, stage 3b: Secondary | ICD-10-CM | POA: Diagnosis not present

## 2021-06-16 DIAGNOSIS — Z79899 Other long term (current) drug therapy: Secondary | ICD-10-CM | POA: Insufficient documentation

## 2021-06-16 DIAGNOSIS — E669 Obesity, unspecified: Secondary | ICD-10-CM | POA: Diagnosis not present

## 2021-06-16 DIAGNOSIS — M7989 Other specified soft tissue disorders: Secondary | ICD-10-CM | POA: Diagnosis not present

## 2021-06-16 DIAGNOSIS — R001 Bradycardia, unspecified: Secondary | ICD-10-CM | POA: Diagnosis not present

## 2021-06-16 DIAGNOSIS — R0609 Other forms of dyspnea: Secondary | ICD-10-CM | POA: Diagnosis not present

## 2021-06-16 DIAGNOSIS — R748 Abnormal levels of other serum enzymes: Secondary | ICD-10-CM | POA: Diagnosis not present

## 2021-06-16 DIAGNOSIS — D6869 Other thrombophilia: Secondary | ICD-10-CM | POA: Diagnosis not present

## 2021-06-16 DIAGNOSIS — R9431 Abnormal electrocardiogram [ECG] [EKG]: Secondary | ICD-10-CM | POA: Diagnosis not present

## 2021-06-16 DIAGNOSIS — Z6832 Body mass index (BMI) 32.0-32.9, adult: Secondary | ICD-10-CM | POA: Insufficient documentation

## 2021-06-16 DIAGNOSIS — M109 Gout, unspecified: Secondary | ICD-10-CM | POA: Diagnosis not present

## 2021-06-16 DIAGNOSIS — Z87891 Personal history of nicotine dependence: Secondary | ICD-10-CM | POA: Insufficient documentation

## 2021-06-16 DIAGNOSIS — K820 Obstruction of gallbladder: Secondary | ICD-10-CM | POA: Diagnosis not present

## 2021-06-16 DIAGNOSIS — N179 Acute kidney failure, unspecified: Secondary | ICD-10-CM | POA: Diagnosis not present

## 2021-06-16 DIAGNOSIS — Z7901 Long term (current) use of anticoagulants: Secondary | ICD-10-CM | POA: Insufficient documentation

## 2021-06-16 DIAGNOSIS — R0683 Snoring: Secondary | ICD-10-CM | POA: Insufficient documentation

## 2021-06-16 DIAGNOSIS — J811 Chronic pulmonary edema: Secondary | ICD-10-CM | POA: Diagnosis not present

## 2021-06-16 DIAGNOSIS — I5022 Chronic systolic (congestive) heart failure: Secondary | ICD-10-CM | POA: Diagnosis not present

## 2021-06-16 DIAGNOSIS — I4891 Unspecified atrial fibrillation: Secondary | ICD-10-CM | POA: Diagnosis not present

## 2021-06-16 DIAGNOSIS — Z8249 Family history of ischemic heart disease and other diseases of the circulatory system: Secondary | ICD-10-CM | POA: Insufficient documentation

## 2021-06-16 DIAGNOSIS — I959 Hypotension, unspecified: Secondary | ICD-10-CM | POA: Diagnosis not present

## 2021-06-16 DIAGNOSIS — R188 Other ascites: Secondary | ICD-10-CM | POA: Diagnosis not present

## 2021-06-16 DIAGNOSIS — Z09 Encounter for follow-up examination after completed treatment for conditions other than malignant neoplasm: Secondary | ICD-10-CM | POA: Diagnosis not present

## 2021-06-16 DIAGNOSIS — E785 Hyperlipidemia, unspecified: Secondary | ICD-10-CM | POA: Insufficient documentation

## 2021-06-16 DIAGNOSIS — E875 Hyperkalemia: Secondary | ICD-10-CM | POA: Diagnosis not present

## 2021-06-16 MED ORDER — AMIODARONE HCL 200 MG PO TABS: 200.0000 mg | ORAL_TABLET | Freq: Every day | ORAL | 3 refills | Status: DC

## 2021-06-16 NOTE — Progress Notes (Signed)
Primary Care Physician: Kristen Loader, FNP Primary Cardiologist: Dr Burt Knack Primary Electrophysiologist: none Referring Physician: Dr Lidia Collum is a 70 y.o. male with a history of CKD, HLD, HFrEF, atrial fibrillation who presents for consultation in the [facility]. Patient presented to his PCP with worsening SOB and palpitations for one month. He was found to be in afib with RVR and was sent to the ED. ECG showed afib with rapid rates. Echo revealed EF 20-25%. He was started on Eliquis for a CHADS2VASC score of 2. Plan was for TEE guided DCCV but TEE showed heavy smoke and concern for thrombus. He was loaded on amiodarone and started on metoprolol and digoxin for rate control. Patient is in [place] today. He does feel a little better with more energy but is not back to baseline. He denies alcohol use but does admit to snoring and witnessed apnea (wife confirms).   Today, he denies symptoms of palpitations, chest pain, orthopnea, PND, lower extremity edema, dizziness, presyncope, syncope, bleeding, or neurologic sequela. The patient is tolerating medications without difficulties and is otherwise without complaint today.    Atrial Fibrillation Risk Factors:  he does have symptoms or diagnosis of sleep apnea. he does not have a history of rheumatic fever. he does not have a history of alcohol use. The patient does not have a history of early familial atrial fibrillation or other arrhythmias.  he has a BMI of Body mass index is 32.22 kg/m.Marland Kitchen Filed Weights   06/16/21 1324  Weight: 104.8 kg    Family History  Problem Relation Age of Onset   Heart disease Father      Atrial Fibrillation Management history:  Previous antiarrhythmic drugs: amiodarone  Previous cardioversions: none Previous ablations: none CHADS2VASC score: 2 Anticoagulation history: Eliquis   Past Medical History:  Diagnosis Date   CKD (chronic kidney disease) stage 3,  GFR 30-59 ml/min (HCC)    Gout    HLD (hyperlipidemia)    Past Surgical History:  Procedure Laterality Date   TEE WITHOUT CARDIOVERSION N/A 06/10/2021   Procedure: TRANSESOPHAGEAL ECHOCARDIOGRAM (TEE);  Surgeon: Werner Lean, MD;  Location: [facility] ENDOSCOPY;  Service: Cardiovascular;  Laterality: N/A;    Current Outpatient Medications  Medication Sig Dispense Refill   acetaminophen (TYLENOL) 325 MG tablet Take 650 mg by mouth every 6 (six) hours as needed for mild pain, moderate pain or headache.     allopurinol (ZYLOPRIM) 100 MG tablet Take 100 mg by mouth daily.     apixaban (ELIQUIS) 5 MG TABS tablet Take 1 tablet (5 mg total) by mouth 2 (two) times daily. 60 tablet 0   atorvastatin (LIPITOR) 10 MG tablet Take 10 mg by mouth daily.     digoxin (LANOXIN) 0.125 MG tablet Take 1 tablet (0.125 mg total) by mouth daily. 30 tablet 0   metoprolol succinate (TOPROL-XL) 25 MG 24 hr tablet Take 3 tablets (75 mg total) by mouth daily. 90 tablet 0   [START ON 06/21/2021] amiodarone (PACERONE) 200 MG tablet Take 1 tablet (200 mg total) by mouth daily. 30 tablet 3   No current facility-administered medications for this encounter.    Allergies  Allergen Reactions   Colchicine     Sweating    Social History   Socioeconomic History   Marital status: Married    Spouse name: Not on file   Number of children: Not on file   Years of education: Not on file   Highest  education level: Not on file  Occupational History   Not on file  Tobacco Use   Smoking status: Former    Types: Cigarettes   Smokeless tobacco: Never   Tobacco comments:    Former smoker 06/16/2021  Substance and Sexual Activity   Alcohol use: Yes    Alcohol/week: 2.0 - 3.0 standard drinks    Types: 2 - 3 Glasses of wine per week    Comment: 2-3 glasses weekly 06/16/2021   Drug use: Never   Sexual activity: Not on file  Other Topics Concern   Not on file  Social History Narrative   Not on file   Social  Determinants of Health   Financial Resource Strain: Not on file  Food Insecurity: Not on file  Transportation Needs: Not on file  Physical Activity: Not on file  Stress: Not on file  Social Connections: Not on file  Intimate Partner Violence: Not on file     ROS- All systems are reviewed and negative except as per the HPI above.  Physical Exam: Vitals:   06/16/21 1324  BP: 118/80  Pulse: 62  Weight: 104.8 kg  Height: 5\' 11"  (1.803 m)    GEN- The patient is a well appearing obese male, alert and oriented x 3 today.   Head- normocephalic, atraumatic Eyes-  Sclera clear, conjunctiva pink Ears- hearing intact Oropharynx- clear Neck- supple  Lungs- Clear to ausculation bilaterally, normal work of breathing Heart- Regular rate and rhythm, no murmurs, rubs or gallops  GI- soft, NT, ND, + BS Extremities- no clubbing, cyanosis, or edema MS- no significant deformity or atrophy Skin- no rash or lesion Psych- euthymic mood, full affect Neuro- strength and sensation are intact  Wt Readings from Last 3 Encounters:  06/16/21 104.8 kg  06/10/21 98.9 kg    EKG today demonstrates  SR, NST Vent. rate 62 BPM PR interval 192 ms QRS duration 98 ms QT/QTcB 444/450 ms  Echo 06/09/21 demonstrated   1. Left ventricular ejection fraction, by estimation, is 20 to 25%. The  left ventricle has severely decreased function. The left ventricle  demonstrates global hypokinesis. The left ventricular internal cavity size was mildly dilated. Left ventricular diastolic function could not be evaluated.   2. Right ventricular systolic function is moderately reduced. The right  ventricular size is normal. There is moderately elevated pulmonary artery systolic pressure. The estimated right ventricular systolic pressure is 29.4 mmHg.   3. Left atrial size was mildly dilated.   4. Right atrial size was mildly dilated.   5. The mitral valve is grossly normal. Mild to moderate mitral valve   regurgitation. No evidence of mitral stenosis.   6. Tricuspid valve regurgitation is moderate.   7. The aortic valve is tricuspid. Aortic valve regurgitation is not  visualized. No aortic stenosis is present.   8. The inferior vena cava is dilated in size with <50% respiratory  variability, suggesting right atrial pressure of 15 mmHg.   Epic records are reviewed at length today  CHA2DS2-VASc Score = 2  The patient's score is based upon: CHF History: 1 HTN History: 0 Diabetes History: 0 Stroke History: 0 Vascular Disease History: 0 Age Score: 1 Gender Score: 0       ASSESSMENT AND PLAN: 1. Persistent Atrial Fibrillation (ICD10:  I48.19) The patient's CHA2DS2-VASc score is 2, indicating a 2.2% annual risk of stroke.   Patient has converted to SR, will not pursue DCCV. Continue amiodarone 400 mg BID until 06/21/21 and then decrease to  200 mg daily.  Continue Toprol 75 mg daily Continue Eliquis 5 mg BID Continue digoxin 0.125 mg daily, level checked yesterday 0.5.   2. Secondary Hypercoagulable State (ICD10:  D68.69) The patient is at significant risk for stroke/thromboembolism based upon his CHA2DS2-VASc Score of 2.  Continue Apixaban (Eliquis).   3. Obesity Body mass index is 32.22 kg/m. Lifestyle modification was discussed at length including regular exercise and weight reduction.  4. Snoring/witnessed apnea The importance of adequate treatment of sleep apnea was discussed today in order to improve our ability to maintain sinus rhythm long term. Will refer for sleep study evaluation.   5. HFrEF EF 20-25%, suspected related to afib. No signs or symptoms of fluid overload today. Defer necessity of ischemic workup to primary cardiologist.  6. Valvular heart disease Mild-moderate MR, moderate TR   Follow up with Ermalinda Barrios as scheduled.    [facility] [address] [place], [place]  [address] [phone] 06/16/2021 2:58 PM

## 2021-06-16 NOTE — Patient Instructions (Signed)
November 6th -- Decrease amiodarone 200mg  once a day

## 2021-06-17 DIAGNOSIS — R7303 Prediabetes: Secondary | ICD-10-CM | POA: Diagnosis not present

## 2021-06-17 DIAGNOSIS — I509 Heart failure, unspecified: Secondary | ICD-10-CM | POA: Diagnosis not present

## 2021-06-17 DIAGNOSIS — Z09 Encounter for follow-up examination after completed treatment for conditions other than malignant neoplasm: Secondary | ICD-10-CM | POA: Diagnosis not present

## 2021-06-17 DIAGNOSIS — I4891 Unspecified atrial fibrillation: Secondary | ICD-10-CM | POA: Diagnosis not present

## 2021-06-17 DIAGNOSIS — N1831 Chronic kidney disease, stage 3a: Secondary | ICD-10-CM | POA: Diagnosis not present

## 2021-06-18 ENCOUNTER — Inpatient Hospital Stay (HOSPITAL_COMMUNITY)
Admission: EM | Admit: 2021-06-18 | Discharge: 2021-06-20 | DRG: 683 | Disposition: A | Discharge status: Home / Self Care | Payer: Medicare Other | Attending: Internal Medicine | Admitting: Internal Medicine

## 2021-06-18 ENCOUNTER — Other Ambulatory Visit: Payer: Self-pay

## 2021-06-18 ENCOUNTER — Emergency Department (HOSPITAL_COMMUNITY): Payer: Medicare Other

## 2021-06-18 ENCOUNTER — Encounter (HOSPITAL_COMMUNITY): Payer: Self-pay | Admitting: Family Medicine

## 2021-06-18 DIAGNOSIS — R0609 Other forms of dyspnea: Secondary | ICD-10-CM | POA: Diagnosis present

## 2021-06-18 DIAGNOSIS — R001 Bradycardia, unspecified: Secondary | ICD-10-CM | POA: Diagnosis present

## 2021-06-18 DIAGNOSIS — Z20822 Contact with and (suspected) exposure to covid-19: Secondary | ICD-10-CM | POA: Diagnosis present

## 2021-06-18 DIAGNOSIS — E669 Obesity, unspecified: Secondary | ICD-10-CM | POA: Diagnosis present

## 2021-06-18 DIAGNOSIS — Z6832 Body mass index (BMI) 32.0-32.9, adult: Secondary | ICD-10-CM

## 2021-06-18 DIAGNOSIS — E785 Hyperlipidemia, unspecified: Secondary | ICD-10-CM | POA: Diagnosis present

## 2021-06-18 DIAGNOSIS — N179 Acute kidney failure, unspecified: Principal | ICD-10-CM | POA: Diagnosis present

## 2021-06-18 DIAGNOSIS — Z79899 Other long term (current) drug therapy: Secondary | ICD-10-CM | POA: Diagnosis not present

## 2021-06-18 DIAGNOSIS — I4819 Other persistent atrial fibrillation: Secondary | ICD-10-CM | POA: Diagnosis present

## 2021-06-18 DIAGNOSIS — K820 Obstruction of gallbladder: Secondary | ICD-10-CM | POA: Diagnosis not present

## 2021-06-18 DIAGNOSIS — M109 Gout, unspecified: Secondary | ICD-10-CM | POA: Diagnosis present

## 2021-06-18 DIAGNOSIS — I959 Hypotension, unspecified: Secondary | ICD-10-CM | POA: Diagnosis present

## 2021-06-18 DIAGNOSIS — N1832 Chronic kidney disease, stage 3b: Secondary | ICD-10-CM | POA: Diagnosis present

## 2021-06-18 DIAGNOSIS — N189 Chronic kidney disease, unspecified: Secondary | ICD-10-CM | POA: Diagnosis not present

## 2021-06-18 DIAGNOSIS — E875 Hyperkalemia: Secondary | ICD-10-CM | POA: Diagnosis present

## 2021-06-18 DIAGNOSIS — Z8249 Family history of ischemic heart disease and other diseases of the circulatory system: Secondary | ICD-10-CM | POA: Diagnosis not present

## 2021-06-18 DIAGNOSIS — J811 Chronic pulmonary edema: Secondary | ICD-10-CM | POA: Diagnosis not present

## 2021-06-18 DIAGNOSIS — I480 Paroxysmal atrial fibrillation: Secondary | ICD-10-CM

## 2021-06-18 DIAGNOSIS — M7989 Other specified soft tissue disorders: Secondary | ICD-10-CM | POA: Diagnosis not present

## 2021-06-18 DIAGNOSIS — R748 Abnormal levels of other serum enzymes: Secondary | ICD-10-CM | POA: Diagnosis present

## 2021-06-18 DIAGNOSIS — R188 Other ascites: Secondary | ICD-10-CM | POA: Diagnosis not present

## 2021-06-18 DIAGNOSIS — R9431 Abnormal electrocardiogram [ECG] [EKG]: Secondary | ICD-10-CM | POA: Diagnosis present

## 2021-06-18 DIAGNOSIS — Z7901 Long term (current) use of anticoagulants: Secondary | ICD-10-CM

## 2021-06-18 DIAGNOSIS — N19 Unspecified kidney failure: Secondary | ICD-10-CM | POA: Diagnosis not present

## 2021-06-18 DIAGNOSIS — Z87891 Personal history of nicotine dependence: Secondary | ICD-10-CM | POA: Diagnosis not present

## 2021-06-18 DIAGNOSIS — I5022 Chronic systolic (congestive) heart failure: Secondary | ICD-10-CM | POA: Diagnosis present

## 2021-06-18 DIAGNOSIS — D6869 Other thrombophilia: Secondary | ICD-10-CM | POA: Diagnosis present

## 2021-06-18 LAB — URINALYSIS, ROUTINE W REFLEX MICROSCOPIC
Bacteria, UA: NONE SEEN
Bilirubin Urine: NEGATIVE
Glucose, UA: NEGATIVE mg/dL
Ketones, ur: NEGATIVE mg/dL
Leukocytes,Ua: NEGATIVE
Nitrite: NEGATIVE
Protein, ur: NEGATIVE mg/dL
Specific Gravity, Urine: 1.012 (ref 1.005–1.030)
pH: 5 (ref 5.0–8.0)

## 2021-06-18 LAB — CBC WITH DIFFERENTIAL/PLATELET
Abs Immature Granulocytes: 0.15 10*3/uL — ABNORMAL HIGH (ref 0.00–0.07)
Basophils Absolute: 0 10*3/uL (ref 0.0–0.1)
Basophils Relative: 0 %
Eosinophils Absolute: 0.1 10*3/uL (ref 0.0–0.5)
Eosinophils Relative: 1 %
HCT: 37 % — ABNORMAL LOW (ref 39.0–52.0)
Hemoglobin: 12.1 g/dL — ABNORMAL LOW (ref 13.0–17.0)
Immature Granulocytes: 1 %
Lymphocytes Relative: 6 %
Lymphs Abs: 0.8 10*3/uL (ref 0.7–4.0)
MCH: 26.8 pg (ref 26.0–34.0)
MCHC: 32.7 g/dL (ref 30.0–36.0)
MCV: 81.9 fL (ref 80.0–100.0)
Monocytes Absolute: 1.9 10*3/uL — ABNORMAL HIGH (ref 0.1–1.0)
Monocytes Relative: 15 %
Neutro Abs: 10.1 10*3/uL — ABNORMAL HIGH (ref 1.7–7.7)
Neutrophils Relative %: 77 %
Platelets: 248 10*3/uL (ref 150–400)
RBC: 4.52 MIL/uL (ref 4.22–5.81)
RDW: 15.7 % — ABNORMAL HIGH (ref 11.5–15.5)
WBC: 13.2 10*3/uL — ABNORMAL HIGH (ref 4.0–10.5)
nRBC: 0 % (ref 0.0–0.2)

## 2021-06-18 LAB — COMPREHENSIVE METABOLIC PANEL
ALT: 381 U/L — ABNORMAL HIGH (ref 0–44)
AST: 211 U/L — ABNORMAL HIGH (ref 15–41)
Albumin: 2.9 g/dL — ABNORMAL LOW (ref 3.5–5.0)
Alkaline Phosphatase: 167 U/L — ABNORMAL HIGH (ref 38–126)
Anion gap: 9 (ref 5–15)
BUN: 122 mg/dL — ABNORMAL HIGH (ref 8–23)
CO2: 23 mmol/L (ref 22–32)
Calcium: 8.1 mg/dL — ABNORMAL LOW (ref 8.9–10.3)
Chloride: 104 mmol/L (ref 98–111)
Creatinine, Ser: 4.58 mg/dL — ABNORMAL HIGH (ref 0.61–1.24)
GFR, Estimated: 13 mL/min — ABNORMAL LOW (ref >60)
Glucose, Bld: 99 mg/dL (ref 70–99)
Potassium: 5 mmol/L (ref 3.5–5.1)
Sodium: 136 mmol/L (ref 135–145)
Total Bilirubin: 0.9 mg/dL (ref 0.3–1.2)
Total Protein: 6.6 g/dL (ref 6.5–8.1)

## 2021-06-18 LAB — BRAIN NATRIURETIC PEPTIDE: B Natriuretic Peptide: 1958.5 pg/mL — ABNORMAL HIGH (ref 0.0–100.0)

## 2021-06-18 LAB — RESP PANEL BY RT-PCR (FLU A&B, COVID) ARPGX2
Influenza A by PCR: NEGATIVE
Influenza B by PCR: NEGATIVE
SARS Coronavirus 2 by RT PCR: NEGATIVE

## 2021-06-18 LAB — TROPONIN I (HIGH SENSITIVITY)
Troponin I (High Sensitivity): 15 ng/L (ref <18)
Troponin I (High Sensitivity): 13 ng/L (ref <18)

## 2021-06-18 LAB — DIGOXIN LEVEL: Digoxin Level: 0.9 ng/mL (ref 0.8–2.0)

## 2021-06-18 LAB — MAGNESIUM: Magnesium: 2.8 mg/dL — ABNORMAL HIGH (ref 1.7–2.4)

## 2021-06-18 MED ORDER — LACTATED RINGERS IV SOLN: INTRAVENOUS | Status: AC

## 2021-06-18 MED ORDER — ALLOPURINOL 100 MG PO TABS
100.0000 mg | ORAL_TABLET | Freq: Every day | ORAL | Status: DC
  Administered 2021-06-19 – 2021-06-20 (×2): 100 mg via ORAL
  Filled 2021-06-18 (×2): qty 1

## 2021-06-18 MED ORDER — APIXABAN 2.5 MG PO TABS
2.5000 mg | ORAL_TABLET | Freq: Two times a day (BID) | ORAL | Status: DC
  Administered 2021-06-18 – 2021-06-19 (×2): 2.5 mg via ORAL
  Filled 2021-06-18 (×2): qty 1

## 2021-06-18 MED ORDER — DIGOXIN 125 MCG PO TABS
0.1250 mg | ORAL_TABLET | Freq: Every day | ORAL | Status: DC
  Administered 2021-06-19 – 2021-06-20 (×2): 0.125 mg via ORAL
  Filled 2021-06-18 (×2): qty 1

## 2021-06-18 MED ORDER — ACETAMINOPHEN 650 MG RE SUPP: 650.0000 mg | Freq: Four times a day (QID) | RECTAL | Status: DC | PRN

## 2021-06-18 MED ORDER — ACETAMINOPHEN 325 MG PO TABS: 650.0000 mg | ORAL_TABLET | Freq: Four times a day (QID) | ORAL | Status: DC | PRN

## 2021-06-18 MED ORDER — ATORVASTATIN CALCIUM 10 MG PO TABS
10.0000 mg | ORAL_TABLET | Freq: Every day | ORAL | Status: DC
  Administered 2021-06-19 – 2021-06-20 (×2): 10 mg via ORAL
  Filled 2021-06-18 (×2): qty 1

## 2021-06-18 NOTE — ED Provider Notes (Signed)
Emergency Medicine Provider Triage Evaluation Note  Brian Weeks , a 70 y.o. male  was evaluated in triage.  Patient presents to the emergency department for abnormal labs.  Patient was discharged from the hospital on 10/3 1 for new onset A. fib and chronic kidney disease stage 3.  Followed up with his PCP yesterday, and routine labs drawn showed a creatinine of 5.7 and a BUN of 139.  He notes that he has had some swelling to his bilateral legs/feet, but otherwise states that he has been improving since hospital discharge.  Review of Systems  Positive: Bilateral leg swelling Negative: Chest pain, shortness of breath, fatigue, fevers, chills  Physical Exam  There were no vitals taken for this visit. Gen:   Awake, no distress   Resp:  Normal effort  MSK:   Moves extremities without difficulty  Other:  Mild bilateral nonpitting edema, sensation intact, pulses equal and normal in BLE  Medical Decision Making  Medically screening exam initiated at 3:56 PM.  Appropriate orders placed.  Feras Gardella Bazen was informed that the remainder of the evaluation will be completed by another provider, this initial triage assessment does not replace that evaluation, and the importance of remaining in the ED until their evaluation is complete.  Will repeat labs to evaluate for cause of acute renal injury   Estill Cotta 06/18/21 1558    Carmin Muskrat, MD 06/18/21 [phone]

## 2021-06-18 NOTE — ED Notes (Signed)
Patient transported to CT 

## 2021-06-18 NOTE — H&P (Signed)
History and Physical    Brian Weeks SLH:[id number] DOB: 1951/04/09 DOA: 06/18/2021  PCP: Kristen Loader, FNP   Patient coming from: Home  Chief Complaint: worsening renal function on labs  HPI: Brian Weeks is a 70 y.o. male with medical history significant for stage III CKD, paroxysmal A. fib, newly diagnosed HFrEF with EF of 25% presents today for evaluation of worsening kidney function on labs done by PCP. Brian Weeks was discharged from the hospital on June 15, 2021 at that time his creatinine was 3.35 which was elevated from the admission level of 1.6.  He followed up with his PCP yesterday and had blood work done and was called today and told to come to the emergency room as his renal function was worsening.  Today in the emergency room his renal function shows a creatinine of 4.58.  He reports has been feeling well since he was discharged and he has not had any pain in his lower abdomen or pelvic region he has no flank pain.  He states has been making urine normally.  He states that his dyspnea is improved although he still has a little bit of dyspnea with exertion but is much better than when he was admitted last week.  He has some mild trace edema of his legs which is unchanged since discharge he states.  He reports he is now able to lay flat to sleep and does not feel smothered when he lays down.  He has not had any fevers or chills.  He denies any abdominal pain nausea vomiting or diarrhea.  He denies any chest pain or palpitations.  He has been compliant with medications of amiodarone digoxin metoprolol that he was discharged with.  He also takes allopurinol for his gout.  He reports he has not been taking NSAIDs at home.  ED Course: Brian Weeks has been hemodynamically stable in the emergency room.  CT of the abdomen pelvis did not reveal any acute pathology in his kidneys.  He did have some mild ascites noted.  Lab work reveals WBC of 13,200 hemoglobin 12.1 hematocrit 37  platelets 248,000, BNP 1958 which is mildly increased from his level of 1100 last week.  He has no pulmonary edema or interstitial edema on chest x-ray.  Sodium 136 potassium 5.0 chloride 104 bicarb 23 creatinine 4.58 increased from 3.35 on June 15, 2021 BUN 122 which is increased from 84 glucose 99, museum 2.8 alk phos was 167 AST 211 ALT 381 bilirubin 0.9 albumin 2.9.  Liver enzymes are elevated from last week's levels  Review of Systems:  General: Denies  fever, chills, weight loss, night sweats.  Denies dizziness.  Denies change in appetite HENT: Denies head trauma, headache, denies change in hearing, tinnitus.  Denies nasal bleeding.  Denies sore throat.  Denies difficulty swallowing Eyes: Denies blurry vision, pain in eye, drainage.  Denies discoloration of eyes. Neck: Denies pain.  Denies swelling.  Denies pain with movement. Cardiovascular: Denies chest pain, palpitations. Denies worsening edema. Denies orthopnea Respiratory: Denies shortness of breath, cough.  Denies wheezing.  Denies sputum production Gastrointestinal: Denies abdominal pain, swelling.  Denies nausea, vomiting, diarrhea.  Denies melena.  Denies hematemesis. Musculoskeletal: Denies limitation of movement.  Denies deformity or swelling. Denies arthralgias or myalgias. Genitourinary: Denies pelvic pain.  Denies urinary frequency or hesitancy.  Denies dysuria.  Skin: Denies rash.  Denies petechiae, purpura, ecchymosis. Neurological: Denies syncope. Denies seizure activity. Denies paresthesia. Denies slurred speech, drooping face.  Denies visual change.  Psychiatric: Denies depression, anxiety. Denies hallucinations.  Past Medical History:  Diagnosis Date   CKD (chronic kidney disease) stage 3, GFR 30-59 ml/min (HCC)    Gout    HLD (hyperlipidemia)     Past Surgical History:  Procedure Laterality Date   TEE WITHOUT CARDIOVERSION N/A 06/10/2021   Procedure: TRANSESOPHAGEAL ECHOCARDIOGRAM (TEE);  Surgeon: Werner Lean, MD;  Location: [facility] ENDOSCOPY;  Service: Cardiovascular;  Laterality: N/A;    Social History  reports that he has quit smoking. His smoking use included cigarettes. He has never used smokeless tobacco. He reports current alcohol use of about 2.0 - 3.0 standard drinks per week. He reports that he does not use drugs.  Allergies  Allergen Reactions   Colchicine     Sweating    Family History  Problem Relation Age of Onset   Heart disease Father      Prior to Admission medications   Medication Sig Start Date End Date Taking? Authorizing Provider  acetaminophen (TYLENOL) 325 MG tablet Take 650 mg by mouth every 6 (six) hours as needed for mild pain, moderate pain or headache.   Yes [provider]  allopurinol (ZYLOPRIM) 100 MG tablet Take 100 mg by mouth daily. 04/30/21  Yes [provider]  amiodarone (PACERONE) 200 MG tablet Take 1 tablet (200 mg total) by mouth daily. 06/21/21  Yes Fenton, Clint R, PA  apixaban (ELIQUIS) 5 MG TABS tablet Take 1 tablet (5 mg total) by mouth 2 (two) times daily. 06/15/21 07/15/21 Yes Pahwani, Einar Grad, MD  atorvastatin (LIPITOR) 10 MG tablet Take 10 mg by mouth daily. 04/22/21  Yes [provider]  digoxin (LANOXIN) 0.125 MG tablet Take 1 tablet (0.125 mg total) by mouth daily. 06/15/21 07/15/21 Yes Pahwani, Einar Grad, MD  metoprolol succinate (TOPROL-XL) 25 MG 24 hr tablet Take 3 tablets (75 mg total) by mouth daily. 06/15/21 07/15/21 Yes Darliss Cheney, MD    Physical Exam: Vitals:   06/18/21 2045 06/18/21 2100 06/18/21 2115 06/18/21 2145  BP: 122/83 125/84  126/81  Pulse: (!) 55 (!) 55 (!) 54 (!) 52  Resp: (!) 22 15 18  (!) 24  Temp:      TempSrc:      SpO2: 100% 100% 99% 93%  Weight:      Height:        Constitutional: NAD, calm, comfortable Vitals:   06/18/21 2045 06/18/21 2100 06/18/21 2115 06/18/21 2145  BP: 122/83 125/84  126/81  Pulse: (!) 55 (!) 55 (!) 54 (!) 52  Resp: (!) 22 15 18  (!) 24  Temp:       TempSrc:      SpO2: 100% 100% 99% 93%  Weight:      Height:       General: WDWN, Alert and oriented x3.  Eyes: EOMI, PERRL, conjunctivae normal.  Sclera nonicteric HENT:  [place]/AT, external ears normal.  Nares patent without epistasis.  Mucous membranes are moist. Posterior pharynx clear of any exudate or lesions. Normal dentition.  Neck: Soft, normal range of motion, supple, no masses, no thyromegaly.  Trachea midline Respiratory: clear to auscultation bilaterally, no wheezing, no crackles. Normal respiratory effort. No accessory muscle use.  Cardiovascular: Regular rhythm with bradycardia, no murmurs / rubs / gallops. Trace lower extremity edema. 2+ pedal pulses.   Abdomen: Soft, no tenderness, nondistended, no rebound or guarding.  No masses palpated. Bowel sounds normoactive. No CVA tenderness to percussion Musculoskeletal: FROM. no cyanosis. No joint deformity upper and lower extremities. Normal muscle tone.  Skin: Warm, dry, intact no rashes, lesions, ulcers. No induration Neurologic: CN 2-12 grossly intact.  Normal speech. Sensation intact to touch. Strength 5/5 in all extremities.   Psychiatric: Normal judgment and insight.  Normal mood.    Labs on Admission: I have personally reviewed following labs and imaging studies  CBC: Recent Labs  Lab 06/18/21 1604  WBC 13.2*  NEUTROABS 10.1*  HGB 12.1*  HCT 37.0*  MCV 81.9  PLT 195    Basic Metabolic Panel: Recent Labs  Lab 06/12/21 0024 06/13/21 0021 06/14/21 0146 06/15/21 0913 06/18/21 1604 06/18/21 1945  NA 131* 134* 129* 128* 136  --   K 4.6 4.3 4.8 4.8 5.0  --   CL 98 100 99 95* 104  --   CO2 23 24 21* 20* 23  --   GLUCOSE 114* 118* 108* 104* 99  --   BUN 30* 38* 57* 84* 122*  --   CREATININE 1.77* 1.68* 1.99* 3.35* 4.58*  --   CALCIUM 8.7* 8.5* 8.3* 8.3* 8.1*  --   MG  --   --   --   --   --  2.8*    GFR: Estimated Creatinine Clearance: 18.5 mL/min (A) (by C-G formula based on SCr of 4.58 mg/dL (H)).  Liver  Function Tests: Recent Labs  Lab 06/18/21 1604  AST 211*  ALT 381*  ALKPHOS 167*  BILITOT 0.9  PROT 6.6  ALBUMIN 2.9*    Urine analysis:    Component Value Date/Time   COLORURINE YELLOW 06/18/2021 1825   APPEARANCEUR CLEAR 06/18/2021 1825   LABSPEC 1.012 06/18/2021 1825   PHURINE 5.0 06/18/2021 1825   GLUCOSEU NEGATIVE 06/18/2021 1825   HGBUR MODERATE (A) 06/18/2021 1825   BILIRUBINUR NEGATIVE 06/18/2021 1825   KETONESUR NEGATIVE 06/18/2021 1825   PROTEINUR NEGATIVE 06/18/2021 1825   NITRITE NEGATIVE 06/18/2021 1825   LEUKOCYTESUR NEGATIVE 06/18/2021 1825    Radiological Exams on Admission: CT ABDOMEN PELVIS WO CONTRAST  Result Date: 06/18/2021 CLINICAL DATA:  Abdominal pain, elevated LFTs, renal failure EXAM: CT ABDOMEN AND PELVIS WITHOUT CONTRAST TECHNIQUE: Multidetector CT imaging of the abdomen and pelvis was performed following the standard protocol without IV contrast. COMPARISON:  None. FINDINGS: Lower chest: Lung bases are clear. Hepatobiliary: Unenhanced liver is unremarkable. Contracted gallbladder. No intrahepatic or extrahepatic ductal dilatation. Pancreas: Within normal limits. Spleen: Within normal limits. Adrenals/Urinary Tract: Adrenal glands are within normal limits. Kidneys are within normal limits. No renal, ureteral, or bladder calculi. No hydronephrosis. Bladder is within normal limits. Stomach/Bowel: Stomach is within normal limits. No evidence of bowel obstruction. Normal appendix (series 3/image 64). No colonic wall thickening or inflammatory changes. Vascular/Lymphatic: No evidence of abdominal aortic aneurysm. No suspicious abdominopelvic lymphadenopathy. Reproductive: Prostate is unremarkable. Other: Small volume abdominopelvic ascites. Musculoskeletal: Degenerative changes of the visualized thoracolumbar spine. IMPRESSION: Small volume abdominopelvic ascites. No intrahepatic or extrahepatic ductal dilatation on CT. Additional ancillary findings as above.  Electronically Signed   By: Julian Hy M.D.   On: 06/18/2021 20:31   DG Chest 2 View  Result Date: 06/18/2021 CLINICAL DATA:  Bilateral leg swelling, evaluate for pulmonary edema EXAM: CHEST - 2 VIEW COMPARISON:  06/08/2021 FINDINGS: No frank interstitial edema, improved. Lungs are clear. No pleural effusion or pneumothorax. The heart is normal in size. Degenerative changes of the visualized thoracolumbar spine. IMPRESSION: No frank interstitial edema, improved. Electronically Signed   By: Julian Hy M.D.   On: 06/18/2021 21:40    EKG: Independently reviewed.  EKG shows  sinus bradycardia with no acute ST elevation or depression.  Prolonged QT interval.  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 Ramsaran is admitted to medical telemetry floor.  IVF hydration gently with LR at 50 ml/hr overnight Consult Nephrology to help determine etiology of worsening renal function. Possible is Cardiorenal syndrome with hx of HFrEF.  Active Problems:   Chronic HFrEF (heart failure with reduced ejection fraction)  Had echocardiogram last week that showed global hypokinesis with EF of 20-25%. Is on metoprolol which is held due to bradycardia.     AF (paroxysmal atrial fibrillation)  Is on amiodarone and digoxin at home.     Bradycardia Hold metoprolol at this time. Monitor on telemetry. Probably due to antiarrhythmic therapy     Elevated liver enzymes Recheck levels in am. If continue to increase will need GI evaluation    Prolonged QT interval Chronic.  Avoid medications which could further prolong QT interval  DVT prophylaxis: Is on Eliquis for anticoagulation.  Code Status:   Full Code  Family Communication:  Diagnosis and plan discussed with patient.  He verbalized understanding and agrees with plan.  Further recommendations to follow as clinical indicated Disposition Plan:   Patient is from:  Home  Anticipated DC  to:  Home  Anticipated DC date:  Anticipate 2 midnights in the hospital  Consults called:  Nephrology consulted by ER PA  Admission status:  Inpatient  Yevonne Aline Jakya Dovidio MD [facility]  How to contact the [facility] Attending or Consulting provider [place] or covering provider during after hours [place], for this patient?   Check the care team in [facility] and look for a) attending/consulting [facility] provider listed and b) the [facility] team listed Log into [email or web address] and use [place]'s universal password to access. If you do not have the password, please contact the hospital operator. Locate the [facility] provider you are looking for under [facility] and page to a number that you can be directly reached. If you still have difficulty reaching the provider, please page the [facility] (Director on Call) for the Hospitalists listed on amion for assistance.  06/18/2021, 10:20 PM

## 2021-06-18 NOTE — ED Provider Notes (Signed)
[facility] EMERGENCY DEPARTMENT Provider Note   CSN: [id number] Arrival date & time: 06/18/21  1424     History Chief Complaint  Patient presents with   Abnormal Lab    Brian Weeks is a 70 y.o. male.  70 year old male with a history of stage III CKD, paroxysmal A. fib, newly diagnosed HFrEF with EF of 25-30% presents today for evaluation of worsening kidney function.  Patient was discharged from the hospital on 10/31 with a creatinine of 3.35 which was up from his baseline.  Patient had a follow-up with his PCP for hospital follow-up as well as a repeat chemistry panel.  On repeat chemistry panel patient had worsening renal function and was referred to the emergency room.  Patient's renal function today is 4.58.  Patient reports since discharge he is overall feeling well with improvement in his dyspnea on exertion however he does still have dyspnea on exertion, fatigue, and weakness.  Since discharge he has also developed swelling of his bilateral feet and ankle.  He denies any difficulty urinating, chest pain, orthopnea, PND, abdominal distention, or palpitations.  The history is provided by the patient. No language interpreter was used.      Past Medical History:  Diagnosis Date   CKD (chronic kidney disease) stage 3, GFR 30-59 ml/min (HCC)    Gout    HLD (hyperlipidemia)     Patient Active Problem List   Diagnosis Date Noted   Persistent atrial fibrillation ([place]) 06/16/2021   Secondary hypercoagulable state ([place]) 25/36/6440   Acute systolic (congestive) heart failure (HCC)    Atrial fibrillation with RVR ([place]) 06/08/2021   Acute CHF (congestive heart failure) ([place]) 06/08/2021   AKI (acute kidney injury) ([place]) 06/08/2021   Prolonged QT interval 06/08/2021   ANXIETY 10/27/2007   HYPERLIPIDEMIA 03/29/2007    Past Surgical History:  Procedure Laterality Date   TEE WITHOUT CARDIOVERSION N/A 06/10/2021   Procedure: TRANSESOPHAGEAL ECHOCARDIOGRAM  (TEE);  Surgeon: Werner Lean, MD;  Location: [facility] ENDOSCOPY;  Service: Cardiovascular;  Laterality: N/A;       Family History  Problem Relation Age of Onset   Heart disease Father     Social History   Tobacco Use   Smoking status: Former    Types: Cigarettes   Smokeless tobacco: Never   Tobacco comments:    Former smoker 06/16/2021  Substance Use Topics   Alcohol use: Yes    Alcohol/week: 2.0 - 3.0 standard drinks    Types: 2 - 3 Glasses of wine per week    Comment: 2-3 glasses weekly 06/16/2021   Drug use: Never    Home Medications Prior to Admission medications   Medication Sig Start Date End Date Taking? Authorizing Provider  acetaminophen (TYLENOL) 325 MG tablet Take 650 mg by mouth every 6 (six) hours as needed for mild pain, moderate pain or headache.   Yes [provider]  allopurinol (ZYLOPRIM) 100 MG tablet Take 100 mg by mouth daily. 04/30/21  Yes [provider]  amiodarone (PACERONE) 200 MG tablet Take 1 tablet (200 mg total) by mouth daily. 06/21/21  Yes Fenton, Clint R, PA  apixaban (ELIQUIS) 5 MG TABS tablet Take 1 tablet (5 mg total) by mouth 2 (two) times daily. 06/15/21 07/15/21 Yes Pahwani, Einar Grad, MD  atorvastatin (LIPITOR) 10 MG tablet Take 10 mg by mouth daily. 04/22/21  Yes [provider]  digoxin (LANOXIN) 0.125 MG tablet Take 1 tablet (0.125 mg total) by mouth daily. 06/15/21 07/15/21 Yes Pahwani,  Einar Grad, MD  metoprolol succinate (TOPROL-XL) 25 MG 24 hr tablet Take 3 tablets (75 mg total) by mouth daily. 06/15/21 07/15/21 Yes Darliss Cheney, MD    Allergies    Colchicine  Review of Systems   Review of Systems  Constitutional:  Positive for fatigue. Negative for appetite change, chills and fever.  Respiratory:  Positive for shortness of breath.   Cardiovascular:  Positive for leg swelling. Negative for chest pain and palpitations.  Gastrointestinal:  Negative for abdominal pain, constipation, nausea and vomiting.   Genitourinary:  Negative for decreased urine volume and difficulty urinating.  Neurological:  Positive for weakness. Negative for light-headedness.  All other systems reviewed and are negative.  Physical Exam Updated Vital Signs BP 121/78   Pulse (!) 55   Temp 98.4 F (36.9 C) (Oral)   Resp (!) 22   Ht $R'5\' 11"'Nq$  (1.803 m)   Wt 104.8 kg   SpO2 95%   BMI 32.22 kg/m   Physical Exam Vitals and nursing note reviewed.  Constitutional:      General: He is not in acute distress.    Appearance: Normal appearance. He is not ill-appearing.  HENT:     Head: Normocephalic and atraumatic.     Nose: Nose normal.  Eyes:     General: No scleral icterus.    Extraocular Movements: Extraocular movements intact.     Conjunctiva/sclera: Conjunctivae normal.  Cardiovascular:     Rate and Rhythm: Regular rhythm. Bradycardia present.     Pulses: Normal pulses.     Heart sounds: Normal heart sounds.  Pulmonary:     Effort: Pulmonary effort is normal. No respiratory distress.     Breath sounds: Normal breath sounds. No wheezing or rales.  Abdominal:     General: There is no distension.     Tenderness: There is no abdominal tenderness.  Musculoskeletal:        General: Normal range of motion.     Cervical back: Normal range of motion.     Right lower leg: No edema.     Left lower leg: No edema.  Skin:    General: Skin is warm and dry.  Neurological:     General: No focal deficit present.     Mental Status: He is alert. Mental status is at baseline.    ED Results / Procedures / Treatments   Labs (all labs ordered are listed, but only abnormal results are displayed) Labs Reviewed  CBC WITH DIFFERENTIAL/PLATELET - Abnormal; Notable for the following components:      Result Value   WBC 13.2 (*)    Hemoglobin 12.1 (*)    HCT 37.0 (*)    RDW 15.7 (*)    Neutro Abs 10.1 (*)    Monocytes Absolute 1.9 (*)    Abs Immature Granulocytes 0.15 (*)    All other components within normal limits   COMPREHENSIVE METABOLIC PANEL - Abnormal; Notable for the following components:   BUN 122 (*)    Creatinine, Ser 4.58 (*)    Calcium 8.1 (*)    Albumin 2.9 (*)    AST 211 (*)    ALT 381 (*)    Alkaline Phosphatase 167 (*)    GFR, Estimated 13 (*)    All other components within normal limits  URINALYSIS, ROUTINE W REFLEX MICROSCOPIC - Abnormal; Notable for the following components:   Hgb urine dipstick MODERATE (*)    All other components within normal limits  BRAIN NATRIURETIC PEPTIDE  MAGNESIUM  DIGOXIN  LEVEL  TROPONIN I (HIGH SENSITIVITY)    EKG None  Radiology No results found.  Procedures Procedures   Medications Ordered in ED Medications - No data to display  ED Course  I have reviewed the triage vital signs and the nursing notes.  Pertinent labs & imaging results that were available during my care of the patient were reviewed by me and considered in my medical decision making (see chart for details).    MDM Rules/Calculators/A&P                           70 year old male with a history of HFrEF, A. fib, CKD presents with worsening renal function.  Patient on arrival has a creatinine of 4.58.  Patient since discharge reports weakness, fatigue, dyspnea on exertion but improved from prior to admission.  Patient is currently in sinus bradycardia.  Given patient was recently started on dig will obtain dig level, troponin, magnesium, BNP as well as obtain CT abdomen pelvis given elevated alk phos and transaminitis to look for obstruction.   CT abdomen pelvis without contrast negative for acute findings.  Looks cytosis present at 13.2, hemoglobin 12.1, magnesium 2.8, BNP of 1958 uptrending from prior to discharge.  Troponin within normal limits.  Case discussed with hospitalist for admission.  He will evaluate patient for admission, but requested nephrology be contacted tonight by [place]. Discussed with nephrology who state there is no immediate intervention needed by them,  and they will follow patient during hospitalization.   Final Clinical Impression(s) / ED Diagnoses Final diagnoses:  None    Rx / DC Orders ED Discharge Orders     None        Evlyn Courier, PA-C 06/18/21 2211    Lorelle Gibbs, DO 06/19/21 1620

## 2021-06-18 NOTE — ED Triage Notes (Signed)
Pt from home, recently discharged Monday for new-onset Afib & CKD, followed up w PCP yesterday, advised that kidney function was high (creat 5.7, BUN 139). Swelling noted to bilateral legs/feet, pt denies pain

## 2021-06-18 NOTE — ED Notes (Signed)
Patient transported to X-ray 

## 2021-06-19 DIAGNOSIS — R001 Bradycardia, unspecified: Secondary | ICD-10-CM

## 2021-06-19 LAB — COMPREHENSIVE METABOLIC PANEL
ALT: 316 U/L — ABNORMAL HIGH (ref 0–44)
AST: 143 U/L — ABNORMAL HIGH (ref 15–41)
Albumin: 2.6 g/dL — ABNORMAL LOW (ref 3.5–5.0)
Alkaline Phosphatase: 153 U/L — ABNORMAL HIGH (ref 38–126)
Anion gap: 9 (ref 5–15)
BUN: 104 mg/dL — ABNORMAL HIGH (ref 8–23)
CO2: 22 mmol/L (ref 22–32)
Calcium: 8 mg/dL — ABNORMAL LOW (ref 8.9–10.3)
Chloride: 107 mmol/L (ref 98–111)
Creatinine, Ser: 3.64 mg/dL — ABNORMAL HIGH (ref 0.61–1.24)
GFR, Estimated: 17 mL/min — ABNORMAL LOW (ref >60)
Glucose, Bld: 130 mg/dL — ABNORMAL HIGH (ref 70–99)
Potassium: 4.2 mmol/L (ref 3.5–5.1)
Sodium: 138 mmol/L (ref 135–145)
Total Bilirubin: 1.4 mg/dL — ABNORMAL HIGH (ref 0.3–1.2)
Total Protein: 5.8 g/dL — ABNORMAL LOW (ref 6.5–8.1)

## 2021-06-19 LAB — CK: Total CK: 92 U/L (ref 49–397)

## 2021-06-19 MED ORDER — AMIODARONE HCL 200 MG PO TABS: 200.0000 mg | ORAL_TABLET | Freq: Every day | ORAL | Status: DC

## 2021-06-19 MED ORDER — APIXABAN 5 MG PO TABS
5.0000 mg | ORAL_TABLET | Freq: Two times a day (BID) | ORAL | Status: DC
  Administered 2021-06-19 – 2021-06-20 (×2): 5 mg via ORAL
  Filled 2021-06-19 (×2): qty 1

## 2021-06-19 NOTE — Plan of Care (Signed)

## 2021-06-19 NOTE — Consult Note (Signed)
Nephrology Consult   Requesting provider: Eulogio Bear, DO Service requesting consult: Hospitalist Reason for consult: AKI on CKD 3b   Assessment/Recommendations: Brian Weeks is a/an 70 y.o. male with a past medical history CKD 3B, atrial fibrillation, HFrEF who present w/ AKI on CKD 3b   AKI on CKD3b: we are calling this CKD 3b w/ bl crt of 1.6 presumptively but would need outpatient records to confirm. Has an AKI 2/2 likely intermittent hypotension w/ bradycardia. Improving quickly -Continue with IV hydration until this evening -Continue to monitor daily Cr, Dose meds for GFR -Monitor Daily I/Os, Daily weight  -Maintain MAP>65 for optimal renal perfusion.  -Avoid nephrotoxic medications including NSAIDs and Vanc/Zosyn combo -No signs of obstruction on imaging -Likely will sign off tomorrow if the kidney function continues to improve and would be stable to discharge from nephrology standpoint  HFrEF: Elated to tachycardia per cardiology.  Bradycardia at this time.  Does have pitting edema in the lower extremities but otherwise appears well.  Would restart diuretics in the outpatient setting cautiously   Atrial fibrillation: History of RVR now with bradycardia. Mgmt per primary  Elevated liver function tests: They are downtrending.  Would suggest a possible hypotensive episode with mild ischemic liver injury.   Recommendations conveyed to primary service.    [facility] 06/19/2021 11:50 AM   _____________________________________________________________________________________ CC: AKI on CKD 3B  History of Present Illness: Brian Weeks is a/an 70 y.o. male with a past medical history of CKD 3B, atrial fibrillation, HFrEF who presents with lab abnormalities found by PCP concerning for AKI.  Patient was discharged from the hospital on 10/31.  The patient presented for this admission due to tachycardia and was found to have rapid A. fib  with RVR.  He required significant escalation of medications to control his heart rate.  It is notable that his creatinine was around 1.6 on admission and fluctuated some throughout the hospital stay but on the day of discharge his creatinine jumped from 2 to 3.3.  On arrival to the emergency department the patient's creatinine was 4.6 and creatinine is already improved to 3.6 today.  The patient states he has felt okay since leaving the hospital.  Not having a lot of palpitations.  No significant dizziness or lightheadedness but has noticed that his blood pressure is slightly low.  He denies any issues urinating.  No dysuria or hematuria.  Not taking any NSAIDs.  Denies nausea, vomiting, diarrhea, fevers, chills.  The patient was hydrated overnight.   Medications:  Current Facility-Administered Medications  Medication Dose Route Frequency Provider Last Rate Last Admin   acetaminophen (TYLENOL) tablet 650 mg  650 mg Oral Q6H PRN Chotiner, Yevonne Aline, MD       Or   acetaminophen (TYLENOL) suppository 650 mg  650 mg Rectal Q6H PRN Chotiner, Yevonne Aline, MD       allopurinol (ZYLOPRIM) tablet 100 mg  100 mg Oral Daily Chotiner, Yevonne Aline, MD   100 mg at 06/19/21 0949   apixaban (ELIQUIS) tablet 2.5 mg  2.5 mg Oral BID Chotiner, Yevonne Aline, MD   2.5 mg at 06/19/21 0948   atorvastatin (LIPITOR) tablet 10 mg  10 mg Oral Daily Chotiner, Yevonne Aline, MD   10 mg at 06/19/21 4010   digoxin (LANOXIN) tablet 0.125 mg  0.125 mg Oral Daily Chotiner, Yevonne Aline, MD   0.125 mg at 06/19/21 2725   lactated ringers infusion   Intravenous Continuous Chotiner, Yevonne Aline,  MD 50 mL/hr at 06/18/21 2335 New Bag at 06/18/21 2335     ALLERGIES Colchicine  MEDICAL HISTORY Past Medical History:  Diagnosis Date   CKD (chronic kidney disease) stage 3, GFR 30-59 ml/min (HCC)    Gout    HLD (hyperlipidemia)      SOCIAL HISTORY Social History   Socioeconomic History   Marital status: Married    Spouse name: Not on file    Number of children: Not on file   Years of education: Not on file   Highest education level: Not on file  Occupational History   Not on file  Tobacco Use   Smoking status: Former    Types: Cigarettes   Smokeless tobacco: Never   Tobacco comments:    Former smoker 06/16/2021  Substance and Sexual Activity   Alcohol use: Yes    Alcohol/week: 2.0 - 3.0 standard drinks    Types: 2 - 3 Glasses of wine per week    Comment: 2-3 glasses weekly 06/16/2021   Drug use: Never   Sexual activity: Not on file  Other Topics Concern   Not on file  Social History Narrative   Not on file   Social Determinants of Health   Financial Resource Strain: Not on file  Food Insecurity: Not on file  Transportation Needs: Not on file  Physical Activity: Not on file  Stress: Not on file  Social Connections: Not on file  Intimate Partner Violence: Not on file     FAMILY HISTORY Family History  Problem Relation Age of Onset   Heart disease Father       Review of Systems: 12 systems reviewed Otherwise as per HPI, all other systems reviewed and negative  Physical Exam: Vitals:   06/19/21 0828 06/19/21 1107  BP: 116/70 123/78  Pulse: (!) 57 (!) 56  Resp: 19 19  Temp: 98.1 F (36.7 C) 97.8 F (36.6 C)  SpO2: 99% 97%   Total I/O In: 218 [P.O.:218] Out: 1200 [Urine:1200]  Intake/Output Summary (Last 24 hours) at 06/19/2021 1150 Last data filed at 06/19/2021 1104 Gross per 24 hour  Intake 608.83 ml  Output 2250 ml  Net -1641.17 ml   General: well-appearing, no acute distress HEENT: anicteric sclera, oropharynx clear without lesions CV: regular rate, no obvious murmurs, 1+ pitting edema in the bilateral lower extremities Lungs: clear to auscultation bilaterally, normal work of breathing Abd: soft, non-tender, non-distended Skin: no visible lesions or rashes Psych: alert, engaged, appropriate mood and affect Musculoskeletal: no obvious deformities Neuro: normal speech, no gross focal  deficits   Test Results Reviewed Lab Results  Component Value Date   NA 138 06/19/2021   K 4.2 06/19/2021   CL 107 06/19/2021   CO2 22 06/19/2021   BUN 104 (H) 06/19/2021   CREATININE 3.64 (H) 06/19/2021   CALCIUM 8.0 (L) 06/19/2021   ALBUMIN 2.6 (L) 06/19/2021     I have reviewed all relevant outside healthcare records related to the patient's current hospitalization

## 2021-06-19 NOTE — Progress Notes (Signed)
Progress Note    Brian Weeks  LEX:[id number] DOB: 12-24-1950  DOA: 06/18/2021 PCP: Kristen Loader, FNP    Brief Narrative:     Medical records reviewed and are as summarized below:  Brian Weeks is an 70 y.o. male with medical history significant for stage III CKD, paroxysmal A. fib, newly diagnosed HFrEF with EF of 25% presents today for evaluation of worsening kidney function on labs done by PCP. Brian Weeks was discharged from the hospital on June 15, 2021 at that time his creatinine was 3.35 which was elevated from the admission level of 1.6.    Assessment/Plan:   Principal Problem:   Acute kidney injury superimposed on CKD ([place]) Active Problems:   Prolonged QT interval   Chronic HFrEF (heart failure with reduced ejection fraction) (HCC)   AF (paroxysmal atrial fibrillation) (HCC)   Bradycardia   Elevated liver enzymes    Acute kidney injury superimposed on CKD 3B IVF hydration gently with LR at 50 ml/hr overnight -avoid hypotension and bradycardia -nephrology consult appreciated      Chronic HFrEF (heart failure with reduced ejection fraction)  Had echocardiogram last week that showed global hypokinesis with EF of 20-25%.  -consult HF team    AF (paroxysmal atrial fibrillation)  -on amiodarone and digoxin at home.  -BB on hold -continue eliquis     Bradycardia -resume BB when able      Elevated liver enzymes -trending down -avoid hypotension     Prolonged QT interval Chronic.  Avoid medications which could further prolong QT interval  obesity Body mass index is 31.72 kg/m.   Family Communication/Anticipated D/C date and plan/Code Status   DVT prophylaxis: eliquis Code Status: Full Code.  Family Communication:  Disposition Plan: Status is: Inpatient  Remains inpatient appropriate because: sick         Medical Consultants:   nephrology  Subjective:   Feels well- thinks he was overmedicated last admission -does not  remember ever seeing Dr. Einar Gip (d/c summary said to follow up there)  Objective:    Vitals:   06/19/21 0041 06/19/21 0411 06/19/21 0828 06/19/21 1107  BP: 121/72 110/67 116/70 123/78  Pulse: (!) 55 (!) 56 (!) 57 (!) 56  Resp: 18 18 19 19   Temp: 97.6 F (36.4 C) (!) 97.1 F (36.2 C) 98.1 F (36.7 C) 97.8 F (36.6 C)  TempSrc: Oral Oral Oral Oral  SpO2: 100% 100% 99% 97%  Weight: 103.1 kg     Height: 5\' 11"  (1.803 m)       Intake/Output Summary (Last 24 hours) at 06/19/2021 1331 Last data filed at 06/19/2021 1320 Gross per 24 hour  Intake 1265.83 ml  Output 2450 ml  Net -1184.17 ml   Filed Weights   06/18/21 1931 06/19/21 0041  Weight: 104.8 kg 103.1 kg    Exam:  General: Appearance:    Obese male in no acute distress     Lungs:      respirations unlabored  Heart:    Bradycardic.   MS:   All extremities are intact.    Neurologic:   Awake, alert, oriented x 3. No apparent focal neurological           defect.      Data Reviewed:   I have personally reviewed following labs and imaging studies:  Labs: Labs show the following:   Basic Metabolic Panel: Recent Labs  Lab 06/13/21 0021 06/14/21 0146 06/15/21 0913 06/18/21 1604 06/18/21 1945 06/19/21 0411  NA 134* 129* 128* 136  --  138  K 4.3 4.8 4.8 5.0  --  4.2  CL 100 99 95* 104  --  107  CO2 24 21* 20* 23  --  22  GLUCOSE 118* 108* 104* 99  --  130*  BUN 38* 57* 84* 122*  --  104*  CREATININE 1.68* 1.99* 3.35* 4.58*  --  3.64*  CALCIUM 8.5* 8.3* 8.3* 8.1*  --  8.0*  MG  --   --   --   --  2.8*  --    GFR Estimated Creatinine Clearance: 23.1 mL/min (A) (by C-G formula based on SCr of 3.64 mg/dL (H)). Liver Function Tests: Recent Labs  Lab 06/18/21 1604 06/19/21 0411  AST 211* 143*  ALT 381* 316*  ALKPHOS 167* 153*  BILITOT 0.9 1.4*  PROT 6.6 5.8*  ALBUMIN 2.9* 2.6*   No results for input(s): LIPASE, AMYLASE in the last 168 hours. No results for input(s): AMMONIA in the last 168  hours. Coagulation profile No results for input(s): INR, PROTIME in the last 168 hours.  CBC: Recent Labs  Lab 06/18/21 1604  WBC 13.2*  NEUTROABS 10.1*  HGB 12.1*  HCT 37.0*  MCV 81.9  PLT 248   Cardiac Enzymes: Recent Labs  Lab 06/19/21 0411  CKTOTAL 92   BNP (last 3 results) No results for input(s): PROBNP in the last 8760 hours. CBG: No results for input(s): GLUCAP in the last 168 hours. D-Dimer: No results for input(s): DDIMER in the last 72 hours. Hgb A1c: No results for input(s): HGBA1C in the last 72 hours. Lipid Profile: No results for input(s): CHOL, HDL, LDLCALC, TRIG, CHOLHDL, LDLDIRECT in the last 72 hours. Thyroid function studies: No results for input(s): TSH, T4TOTAL, T3FREE, THYROIDAB in the last 72 hours.  Invalid input(s): FREET3 Anemia work up: No results for input(s): VITAMINB12, FOLATE, FERRITIN, TIBC, IRON, RETICCTPCT in the last 72 hours. Sepsis Labs: Recent Labs  Lab 06/18/21 1604  WBC 13.2*    Microbiology Recent Results (from the past 240 hour(s))  MRSA Next Gen by PCR, Nasal     Status: None   Collection Time: 06/09/21  9:48 PM   Specimen: Nasal Mucosa; Nasal Swab  Result Value Ref Range Status   MRSA by PCR Next Gen NOT DETECTED NOT DETECTED Final    Comment: (NOTE) The GeneXpert MRSA Assay (FDA approved for NASAL specimens only), is one component of a comprehensive MRSA colonization surveillance program. It is not intended to diagnose MRSA infection nor to guide or monitor treatment for MRSA infections. Test performance is not FDA approved in patients less than 98 years old. Performed at [facility], [place] [address] [place], [place] [address]   Resp Panel by RT-PCR (Flu A&B, Covid) Nasopharyngeal Swab     Status: None   Collection Time: 06/18/21 10:13 PM   Specimen: Nasopharyngeal Swab; Nasopharyngeal(NP) swabs in vial transport medium  Result Value Ref Range Status   SARS Coronavirus 2 by RT PCR NEGATIVE NEGATIVE  Final    Comment: (NOTE) SARS-CoV-2 target nucleic acids are NOT DETECTED.  The SARS-CoV-2 RNA is generally detectable in upper respiratory specimens during the acute phase of infection. The lowest concentration of SARS-CoV-2 viral copies this assay can detect is 138 copies/mL. A negative result does not preclude SARS-Cov-2 infection and should not be used as the sole basis for treatment or other patient management decisions. A negative result may occur with  improper specimen collection/handling, submission of  specimen other than nasopharyngeal swab, presence of viral mutation(s) within the areas targeted by this assay, and inadequate number of viral copies(<138 copies/mL). A negative result must be combined with clinical observations, patient history, and epidemiological information. The expected result is Negative.  Fact Sheet for Patients:  EntrepreneurPulse.com.au  Fact Sheet for Healthcare Providers:  IncredibleEmployment.be  This test is no t yet approved or cleared by the [place] FDA and  has been authorized for detection and/or diagnosis of SARS-CoV-2 by FDA under an Emergency Use Authorization (EUA). This EUA will remain  in effect (meaning this test can be used) for the duration of the COVID-19 declaration under Section 564(b)(1) of the Act, 21 U.S.C.section 360bbb-3(b)(1), unless the authorization is terminated  or revoked sooner.       Influenza A by PCR NEGATIVE NEGATIVE Final   Influenza B by PCR NEGATIVE NEGATIVE Final    Comment: (NOTE) The Xpert Xpress SARS-CoV-2/FLU/RSV plus assay is intended as an aid in the diagnosis of influenza from Nasopharyngeal swab specimens and should not be used as a sole basis for treatment. Nasal washings and aspirates are unacceptable for Xpert Xpress SARS-CoV-2/FLU/RSV testing.  Fact Sheet for Patients: EntrepreneurPulse.com.au  Fact Sheet for Healthcare  Providers: IncredibleEmployment.be  This test is not yet approved or cleared by the [place] FDA and has been authorized for detection and/or diagnosis of SARS-CoV-2 by FDA under an Emergency Use Authorization (EUA). This EUA will remain in effect (meaning this test can be used) for the duration of the COVID-19 declaration under Section 564(b)(1) of the Act, 21 U.S.C. section 360bbb-3(b)(1), unless the authorization is terminated or revoked.  Performed at [facility], [place] [address] [place], [place] [address]     Procedures and diagnostic studies:  CT ABDOMEN PELVIS WO CONTRAST  Result Date: 06/18/2021 CLINICAL DATA:  Abdominal pain, elevated LFTs, renal failure EXAM: CT ABDOMEN AND PELVIS WITHOUT CONTRAST TECHNIQUE: Multidetector CT imaging of the abdomen and pelvis was performed following the standard protocol without IV contrast. COMPARISON:  None. FINDINGS: Lower chest: Lung bases are clear. Hepatobiliary: Unenhanced liver is unremarkable. Contracted gallbladder. No intrahepatic or extrahepatic ductal dilatation. Pancreas: Within normal limits. Spleen: Within normal limits. Adrenals/Urinary Tract: Adrenal glands are within normal limits. Kidneys are within normal limits. No renal, ureteral, or bladder calculi. No hydronephrosis. Bladder is within normal limits. Stomach/Bowel: Stomach is within normal limits. No evidence of bowel obstruction. Normal appendix (series 3/image 64). No colonic wall thickening or inflammatory changes. Vascular/Lymphatic: No evidence of abdominal aortic aneurysm. No suspicious abdominopelvic lymphadenopathy. Reproductive: Prostate is unremarkable. Other: Small volume abdominopelvic ascites. Musculoskeletal: Degenerative changes of the visualized thoracolumbar spine. IMPRESSION: Small volume abdominopelvic ascites. No intrahepatic or extrahepatic ductal dilatation on CT. Additional ancillary findings as above. Electronically Signed    By: Julian Hy M.D.   On: 06/18/2021 20:31   DG Chest 2 View  Result Date: 06/18/2021 CLINICAL DATA:  Bilateral leg swelling, evaluate for pulmonary edema EXAM: CHEST - 2 VIEW COMPARISON:  06/08/2021 FINDINGS: No frank interstitial edema, improved. Lungs are clear. No pleural effusion or pneumothorax. The heart is normal in size. Degenerative changes of the visualized thoracolumbar spine. IMPRESSION: No frank interstitial edema, improved. Electronically Signed   By: Julian Hy M.D.   On: 06/18/2021 21:40    Medications:    allopurinol  100 mg Oral Daily   apixaban  2.5 mg Oral BID   atorvastatin  10 mg Oral Daily   digoxin  0.125 mg Oral Daily  Continuous Infusions:  lactated ringers 50 mL/hr at 06/18/21 2335     LOS: 1 day   Geradine Girt  [facility]   How to contact the [facility] Attending or Consulting provider [place] or covering provider during after hours [place], for this patient?  Check the care team in [facility] and look for a) attending/consulting [facility] provider listed and b) the [facility] team listed Log into [email or web address] and use [place]'s universal password to access. If you do not have the password, please contact the hospital operator. Locate the [facility] provider you are looking for under [facility] and page to a number that you can be directly reached. If you still have difficulty reaching the provider, please page the [facility] (Director on Call) for the Hospitalists listed on amion for assistance.  06/19/2021, 1:31 PM

## 2021-06-19 NOTE — Progress Notes (Signed)
Mobility Specialist Progress Note:   06/19/21 1216  Mobility  Activity Ambulated in hall  Level of Assistance Standby assist, set-up cues, supervision of patient - no hands on  Assistive Device None  Distance Ambulated (ft) 500 ft  Mobility Ambulated with assistance in hallway  Mobility Response Tolerated well  Mobility performed by Mobility specialist  Bed Position Chair  $Mobility charge 1 Mobility   Pt received in bed willing to participate in mobility. No complaints of pain and asymptomatic throughout walk. Pt returned to chair with call bell in reach and all needs met.   [facility] Health and safety inspector Phone [phone]

## 2021-06-20 LAB — COMPREHENSIVE METABOLIC PANEL
ALT: 226 U/L — ABNORMAL HIGH (ref 0–44)
AST: 83 U/L — ABNORMAL HIGH (ref 15–41)
Albumin: 2.6 g/dL — ABNORMAL LOW (ref 3.5–5.0)
Alkaline Phosphatase: 129 U/L — ABNORMAL HIGH (ref 38–126)
Anion gap: 9 (ref 5–15)
BUN: 55 mg/dL — ABNORMAL HIGH (ref 8–23)
CO2: 21 mmol/L — ABNORMAL LOW (ref 22–32)
Calcium: 8.2 mg/dL — ABNORMAL LOW (ref 8.9–10.3)
Chloride: 112 mmol/L — ABNORMAL HIGH (ref 98–111)
Creatinine, Ser: 2.22 mg/dL — ABNORMAL HIGH (ref 0.61–1.24)
GFR, Estimated: 31 mL/min — ABNORMAL LOW (ref >60)
Glucose, Bld: 77 mg/dL (ref 70–99)
Potassium: 5.2 mmol/L — ABNORMAL HIGH (ref 3.5–5.1)
Sodium: 142 mmol/L (ref 135–145)
Total Bilirubin: 1.2 mg/dL (ref 0.3–1.2)
Total Protein: 6 g/dL — ABNORMAL LOW (ref 6.5–8.1)

## 2021-06-20 LAB — BASIC METABOLIC PANEL
Anion gap: 8 (ref 5–15)
BUN: 67 mg/dL — ABNORMAL HIGH (ref 8–23)
CO2: 21 mmol/L — ABNORMAL LOW (ref 22–32)
Calcium: 8.1 mg/dL — ABNORMAL LOW (ref 8.9–10.3)
Chloride: 111 mmol/L (ref 98–111)
Creatinine, Ser: 2.47 mg/dL — ABNORMAL HIGH (ref 0.61–1.24)
GFR, Estimated: 27 mL/min — ABNORMAL LOW (ref >60)
Glucose, Bld: 88 mg/dL (ref 70–99)
Potassium: 5.3 mmol/L — ABNORMAL HIGH (ref 3.5–5.1)
Sodium: 140 mmol/L (ref 135–145)

## 2021-06-20 MED ORDER — SODIUM ZIRCONIUM CYCLOSILICATE 5 G PO PACK
5.0000 g | PACK | Freq: Once | ORAL | Status: DC
  Filled 2021-06-20: qty 1

## 2021-06-20 NOTE — Discharge Summary (Signed)
Physician Discharge Summary  Brian Weeks KVQ:[id number] DOB: February 24, 1951 DOA: 06/18/2021  PCP: Kristen Loader, FNP  Admit date: 06/18/2021 Discharge date: 06/20/2021  Admitted From: home Discharge disposition: home   Recommendations for Outpatient Follow-Up:   BMP Monday/Tuesday Avoid hypotension- resume BB as able   Discharge Diagnosis:   Principal Problem:   Acute kidney injury superimposed on CKD ([place]) Active Problems:   Prolonged QT interval   Chronic HFrEF (heart failure with reduced ejection fraction) (HCC)   AF (paroxysmal atrial fibrillation) (HCC)   Bradycardia   Elevated liver enzymes    Discharge Condition: Improved.  Diet recommendation: Low sodium, heart healthy  Wound care: None.  Code status: Full.   History of Present Illness:   Brian Weeks is a 70 y.o. male with medical history significant for stage III CKD, paroxysmal A. fib, newly diagnosed HFrEF with EF of 25% presents today for evaluation of worsening kidney function on labs done by PCP. Mr. Stenzel was discharged from the hospital on June 15, 2021 at that time his creatinine was 3.35 which was elevated from the admission level of 1.6.  He followed up with his PCP yesterday and had blood work done and was called today and told to come to the emergency room as his renal function was worsening.  Today in the emergency room his renal function shows a creatinine of 4.58.  He reports has been feeling well since he was discharged and he has not had any pain in his lower abdomen or pelvic region he has no flank pain.  He states has been making urine normally.  He states that his dyspnea is improved although he still has a little bit of dyspnea with exertion but is much better than when he was admitted last week.  He has some mild trace edema of his legs which is unchanged since discharge he states.  He reports he is now able to lay flat to sleep and does not feel smothered when he lays  down.  He has not had any fevers or chills.  He denies any abdominal pain nausea vomiting or diarrhea.  He denies any chest pain or palpitations.  He has been compliant with medications of amiodarone digoxin metoprolol that he was discharged with.  He also takes allopurinol for his gout.  He reports he has not been taking NSAIDs at home.   Hospital Course by Problem:   AKI on CKD3b:  -CKD 3b w/ bl crt of 1.6 presumptively but would need outpatient records to confirm. Has an AKI 2/2 likely intermittent hypotension w/ bradycardia. Improving quickly -Kidney function continues to improve today.  We will sign off at this time.  Because of his edema diuretics could be used judiciously and started back slowly while monitoring kidney function -Maintain MAP>65 for optimal renal perfusion.  -Avoid nephrotoxic medications including NSAIDs  -No signs of obstruction on imaging   Hyperkalemia: -lokelma x 1-- slightly hemolyzed -bmp Monday/tuesday   HFrEF: Related to tachycardia per cardiology  Atrial fibrillation: History of RVR now with bradycardia -holding BB -close follow up with a fib clinic   Elevated liver function tests: They are downtrending.  Would suggest a possible hypotensive episode with mild ischemic liver injury.    Medical Consultants:      Discharge Exam:   Vitals:   06/20/21 0445 06/20/21 1147  BP: 134/85 132/76  Pulse: 68 64  Resp: 14 18  Temp: 98.4 F (36.9 C) 98.5 F (36.9 C)  SpO2: 98% 97%   Vitals:   06/19/21 1612 06/19/21 2038 06/20/21 0445 06/20/21 1147  BP: 108/61 124/75 134/85 132/76  Pulse: (!) 59 (!) 56 68 64  Resp: 19 14 14 18   Temp: 98.5 F (36.9 C) 98.5 F (36.9 C) 98.4 F (36.9 C) 98.5 F (36.9 C)  TempSrc: Oral Oral Oral Oral  SpO2: 95% 98% 98% 97%  Weight:   102.3 kg   Height:        General exam: Appears calm and comfortable.     The results of significant diagnostics from this hospitalization (including imaging, microbiology,  ancillary and laboratory) are listed below for reference.     Procedures and Diagnostic Studies:   CT ABDOMEN PELVIS WO CONTRAST  Result Date: 06/18/2021 CLINICAL DATA:  Abdominal pain, elevated LFTs, renal failure EXAM: CT ABDOMEN AND PELVIS WITHOUT CONTRAST TECHNIQUE: Multidetector CT imaging of the abdomen and pelvis was performed following the standard protocol without IV contrast. COMPARISON:  None. FINDINGS: Lower chest: Lung bases are clear. Hepatobiliary: Unenhanced liver is unremarkable. Contracted gallbladder. No intrahepatic or extrahepatic ductal dilatation. Pancreas: Within normal limits. Spleen: Within normal limits. Adrenals/Urinary Tract: Adrenal glands are within normal limits. Kidneys are within normal limits. No renal, ureteral, or bladder calculi. No hydronephrosis. Bladder is within normal limits. Stomach/Bowel: Stomach is within normal limits. No evidence of bowel obstruction. Normal appendix (series 3/image 64). No colonic wall thickening or inflammatory changes. Vascular/Lymphatic: No evidence of abdominal aortic aneurysm. No suspicious abdominopelvic lymphadenopathy. Reproductive: Prostate is unremarkable. Other: Small volume abdominopelvic ascites. Musculoskeletal: Degenerative changes of the visualized thoracolumbar spine. IMPRESSION: Small volume abdominopelvic ascites. No intrahepatic or extrahepatic ductal dilatation on CT. Additional ancillary findings as above. Electronically Signed   By: Julian Hy M.D.   On: 06/18/2021 20:31   DG Chest 2 View  Result Date: 06/18/2021 CLINICAL DATA:  Bilateral leg swelling, evaluate for pulmonary edema EXAM: CHEST - 2 VIEW COMPARISON:  06/08/2021 FINDINGS: No frank interstitial edema, improved. Lungs are clear. No pleural effusion or pneumothorax. The heart is normal in size. Degenerative changes of the visualized thoracolumbar spine. IMPRESSION: No frank interstitial edema, improved. Electronically Signed   By: Julian Hy  M.D.   On: 06/18/2021 21:40     Labs:   Basic Metabolic Panel: Recent Labs  Lab 06/15/21 0913 06/18/21 1604 06/18/21 1945 06/19/21 0411 06/20/21 0519 06/20/21 1112  NA 128* 136  --  138 140 142  K 4.8 5.0  --  4.2 5.3* 5.2*  CL 95* 104  --  107 111 112*  CO2 20* 23  --  22 21* 21*  GLUCOSE 104* 99  --  130* 88 77  BUN 84* 122*  --  104* 67* 55*  CREATININE 3.35* 4.58*  --  3.64* 2.47* 2.22*  CALCIUM 8.3* 8.1*  --  8.0* 8.1* 8.2*  MG  --   --  2.8*  --   --   --    GFR Estimated Creatinine Clearance: 37.7 mL/min (A) (by C-G formula based on SCr of 2.22 mg/dL (H)). Liver Function Tests: Recent Labs  Lab 06/18/21 1604 06/19/21 0411 06/20/21 1112  AST 211* 143* 83*  ALT 381* 316* 226*  ALKPHOS 167* 153* 129*  BILITOT 0.9 1.4* 1.2  PROT 6.6 5.8* 6.0*  ALBUMIN 2.9* 2.6* 2.6*   No results for input(s): LIPASE, AMYLASE in the last 168 hours. No results for input(s): AMMONIA in the last 168 hours. Coagulation profile No results for input(s): INR, PROTIME in the  last 168 hours.  CBC: Recent Labs  Lab 06/18/21 1604  WBC 13.2*  NEUTROABS 10.1*  HGB 12.1*  HCT 37.0*  MCV 81.9  PLT 248   Cardiac Enzymes: Recent Labs  Lab 06/19/21 0411  CKTOTAL 92   BNP: Invalid input(s): POCBNP CBG: No results for input(s): GLUCAP in the last 168 hours. D-Dimer No results for input(s): DDIMER in the last 72 hours. Hgb A1c No results for input(s): HGBA1C in the last 72 hours. Lipid Profile No results for input(s): CHOL, HDL, LDLCALC, TRIG, CHOLHDL, LDLDIRECT in the last 72 hours. Thyroid function studies No results for input(s): TSH, T4TOTAL, T3FREE, THYROIDAB in the last 72 hours.  Invalid input(s): FREET3 Anemia work up No results for input(s): VITAMINB12, FOLATE, FERRITIN, TIBC, IRON, RETICCTPCT in the last 72 hours. Microbiology Recent Results (from the past 240 hour(s))  Resp Panel by RT-PCR (Flu A&B, Covid) Nasopharyngeal Swab     Status: None   Collection Time:  06/18/21 10:13 PM   Specimen: Nasopharyngeal Swab; Nasopharyngeal(NP) swabs in vial transport medium  Result Value Ref Range Status   SARS Coronavirus 2 by RT PCR NEGATIVE NEGATIVE Final    Comment: (NOTE) SARS-CoV-2 target nucleic acids are NOT DETECTED.  The SARS-CoV-2 RNA is generally detectable in upper respiratory specimens during the acute phase of infection. The lowest concentration of SARS-CoV-2 viral copies this assay can detect is 138 copies/mL. A negative result does not preclude SARS-Cov-2 infection and should not be used as the sole basis for treatment or other patient management decisions. A negative result may occur with  improper specimen collection/handling, submission of specimen other than nasopharyngeal swab, presence of viral mutation(s) within the areas targeted by this assay, and inadequate number of viral copies(<138 copies/mL). A negative result must be combined with clinical observations, patient history, and epidemiological information. The expected result is Negative.  Fact Sheet for Patients:  EntrepreneurPulse.com.au  Fact Sheet for Healthcare Providers:  IncredibleEmployment.be  This test is no t yet approved or cleared by the [place] FDA and  has been authorized for detection and/or diagnosis of SARS-CoV-2 by FDA under an Emergency Use Authorization (EUA). This EUA will remain  in effect (meaning this test can be used) for the duration of the COVID-19 declaration under Section 564(b)(1) of the Act, 21 U.S.C.section 360bbb-3(b)(1), unless the authorization is terminated  or revoked sooner.       Influenza A by PCR NEGATIVE NEGATIVE Final   Influenza B by PCR NEGATIVE NEGATIVE Final    Comment: (NOTE) The Xpert Xpress SARS-CoV-2/FLU/RSV plus assay is intended as an aid in the diagnosis of influenza from Nasopharyngeal swab specimens and should not be used as a sole basis for treatment. Nasal washings  and aspirates are unacceptable for Xpert Xpress SARS-CoV-2/FLU/RSV testing.  Fact Sheet for Patients: EntrepreneurPulse.com.au  Fact Sheet for Healthcare Providers: IncredibleEmployment.be  This test is not yet approved or cleared by the [place] FDA and has been authorized for detection and/or diagnosis of SARS-CoV-2 by FDA under an Emergency Use Authorization (EUA). This EUA will remain in effect (meaning this test can be used) for the duration of the COVID-19 declaration under Section 564(b)(1) of the Act, 21 U.S.C. section 360bbb-3(b)(1), unless the authorization is terminated or revoked.  Performed at [facility], [place] [address], [place], [place] [address]      Discharge Instructions:   Discharge Instructions     (HEART FAILURE PATIENTS) Call MD:  Anytime you have any of the following symptoms: 1)  3 pound weight gain in 24 hours or 5 pounds in 1 week 2) shortness of breath, with or without a dry hacking cough 3) swelling in the hands, feet or stomach 4) if you have to sleep on extra pillows at night in order to breathe.   Complete by: As directed    Diet - low sodium heart healthy   Complete by: As directed    Heart Failure patients record your daily weight using the same scale at the same time of day   Complete by: As directed    Increase activity slowly   Complete by: As directed       Allergies as of 06/20/2021       Reactions   Colchicine    Sweating        Medication List     STOP taking these medications    metoprolol succinate 25 MG 24 hr tablet Commonly known as: TOPROL-XL       TAKE these medications    acetaminophen 325 MG tablet Commonly known as: TYLENOL Take 650 mg by mouth every 6 (six) hours as needed for mild pain, moderate pain or headache.   allopurinol 100 MG tablet Commonly known as: ZYLOPRIM Take 100 mg by mouth daily.   amiodarone 200 MG tablet Commonly known as:  PACERONE Take 1 tablet (200 mg total) by mouth daily. Start taking on: June 21, 2021   atorvastatin 10 MG tablet Commonly known as: LIPITOR Take 10 mg by mouth daily.   digoxin 0.125 MG tablet Commonly known as: LANOXIN Take 1 tablet (0.125 mg total) by mouth daily.   Eliquis 5 MG Tabs tablet Generic drug: apixaban Take 1 tablet (5 mg total) by mouth 2 (two) times daily.        Follow-up Information     Kristen Loader, FNP Follow up.   Specialty: Family Medicine Why: call Monday for appointment and BMP Contact information: [place] [id number] [phone]                  Time coordinating discharge: 35 min  Signed:  Geradine Girt DO  [facility] 06/20/2021, 1:46 PM

## 2021-06-20 NOTE — Progress Notes (Addendum)
Nephrology Follow-Up Consult note   Assessment/Recommendations: Brian Weeks is a/an 70 y.o. male with a past medical history significant for CKD 3B, atrial fibrillation, HFrEF who present w/ AKI on CKD 3b    AKI on CKD3b: we are calling this CKD 3b w/ bl crt of 1.6 presumptively but would need outpatient records to confirm. Has an AKI 2/2 likely intermittent hypotension w/ bradycardia. Improving quickly -Kidney function continues to improve today.  We will sign off at this time.  Because of his edema diuretics could be used judiciously and started back slowly while monitoring kidney function -Continue to monitor daily Cr, Dose meds for GFR -Monitor Daily I/Os, Daily weight  -Maintain MAP>65 for optimal renal perfusion.  -Avoid nephrotoxic medications including NSAIDs and Vanc/Zosyn combo -No signs of obstruction on imaging  Hyperkalemia: Potassium 5.3 today but some hemolysis was present.  Continue to monitor on labs   HFrEF: Related to tachycardia per cardiology.  Suggest diuretics as above We will sign off at this time.  If further assistance is needed please let us know. Atrial fibrillation: History of RVR now with bradycardia. Mgmt per primary   Elevated liver function tests: They are downtrending.  Would suggest a possible hypotensive episode with mild ischemic liver injury.   Recommendations conveyed to primary service.    [facility] 06/20/2021 9:41 AM  ___________________________________________________________  CC: AKI on CKD  Interval History/Subjective: Patient feels well today without any specific complaints.  Kidney function greatly improved to 2.5.   Medications:  Current Facility-Administered Medications  Medication Dose Route Frequency Provider Last Rate Last Admin   acetaminophen (TYLENOL) tablet 650 mg  650 mg Oral Q6H PRN Chotiner, Yevonne Aline, MD       Or   acetaminophen (TYLENOL) suppository 650 mg  650 mg Rectal Q6H  PRN Chotiner, Yevonne Aline, MD       allopurinol (ZYLOPRIM) tablet 100 mg  100 mg Oral Daily Chotiner, Yevonne Aline, MD   100 mg at 06/20/21 0824   [START ON 06/21/2021] amiodarone (PACERONE) tablet 200 mg  200 mg Oral Daily Vann, Jessica U, DO       apixaban (ELIQUIS) tablet 5 mg  5 mg Oral BID Vann, Jessica U, DO   5 mg at 06/20/21 4431   atorvastatin (LIPITOR) tablet 10 mg  10 mg Oral Daily Chotiner, Yevonne Aline, MD   10 mg at 06/20/21 5400   digoxin (LANOXIN) tablet 0.125 mg  0.125 mg Oral Daily Chotiner, Yevonne Aline, MD   0.125 mg at 06/20/21 8676      Review of Systems: 10 systems reviewed and negative except per interval history/subjective  Physical Exam: Vitals:   06/19/21 2038 06/20/21 0445  BP: 124/75 134/85  Pulse: (!) 56 68  Resp: 14 14  Temp: 98.5 F (36.9 C) 98.4 F (36.9 C)  SpO2: 98% 98%   No intake/output data recorded.  Intake/Output Summary (Last 24 hours) at 06/20/2021 0941 Last data filed at 06/20/2021 0450 Gross per 24 hour  Intake 897 ml  Output 2600 ml  Net -1703 ml   Constitutional: well-appearing, no acute distress ENMT: ears and nose without scars or lesions, MMM CV: normal rate, 1+ pitting edema in the bilateral lower extremities Respiratory: Bilateral chest rise, normal work of breathing Gastrointestinal: soft, non-tender, no palpable masses or hernias Skin: no visible lesions or rashes Psych: alert, judgement/insight appropriate, appropriate mood and affect   Test Results I personally reviewed new and old clinical labs and radiology  tests Lab Results  Component Value Date   NA 140 06/20/2021   K 5.3 (H) 06/20/2021   CL 111 06/20/2021   CO2 21 (L) 06/20/2021   BUN 67 (H) 06/20/2021   CREATININE 2.47 (H) 06/20/2021   CALCIUM 8.1 (L) 06/20/2021   ALBUMIN 2.6 (L) 06/19/2021

## 2021-06-20 NOTE — Discharge Instructions (Signed)
Holding the toprol to avoid HYPOTENSION and BRADYCARDIA which can keep vital organs from being profused BMP Monday/Tuesday

## 2021-06-23 DIAGNOSIS — N1831 Chronic kidney disease, stage 3a: Secondary | ICD-10-CM | POA: Diagnosis not present

## 2021-06-23 DIAGNOSIS — M109 Gout, unspecified: Secondary | ICD-10-CM | POA: Diagnosis not present

## 2021-06-23 DIAGNOSIS — Z09 Encounter for follow-up examination after completed treatment for conditions other than malignant neoplasm: Secondary | ICD-10-CM | POA: Diagnosis not present

## 2021-06-23 NOTE — Progress Notes (Signed)
Cardiology Office Note    Date:  07/01/2021   ID:  Brian, Weeks July 09, 1951, MRN [id number]   PCP:  Brian Weeks, [place]  Cardiologist:  Brian Mocha, MD  Advanced Practice Provider:  No care team member to display Electrophysiologist:  None   [id number]   No chief complaint on file.   History of Present Illness:  Brian ARO is a 70 y.o. male with history of CKD, HLD.   Patient  was admitted to the hospital 05/2021 with acute systolic CHF, and cardiomyopathy and new onset atrial fibrillation.  2D echo LVEF 20 to 25% with moderate RV dysfunction, moderate pulmonary hypertension mild to moderate MR and transesophageal suggested left atrial appendage thrombus.  Etiology of cardiomyopathy was unclear but potentially tachycardia mediated.  Patient was started on amiodarone and to follow-up in the lipid clinic.  Also started on digoxin pending renal level and Toprol.  Was placed on Eliquis with plans for cardioversion in 3 to 4 weeks.  Would likely need TEE guided cardioversion with question of left atrial appendage thrombus.  Patient was seen in A. fib clinic 06/16/2021 and was to continue amiodarone 400 mg twice daily until 06/21/2021 and then decrease to 200 mg daily.  Was also continued on digoxin 0.125 mg Eliquis and Toprol. Was in NSR that day.   Referred for sleep study.  Patient readmitted to the hospital 06/18/2021 with AKI with Crt 4.58.  He became bradycardic and hypotensive and held his beta-blocker in the hospital.  Also had elevated liver function tests possibly secondary to hypotensive episode with mild ischemic liver injury. Crt 2.22 at discharge.  Patient comes in with wife. Back in Afib 108/m. Had a cup of coffee and walked in. Asymptomatic. No chest pain, dyspnea, palpitations, edema has gone down. Hasn't missed eliquis doses. Has Cartia but not working.   Past Medical History:  Diagnosis Date   CKD (chronic kidney  disease) stage 3, GFR 30-59 ml/min (HCC)    Gout    HLD (hyperlipidemia)     Past Surgical History:  Procedure Laterality Date   TEE WITHOUT CARDIOVERSION N/A 06/10/2021   Procedure: TRANSESOPHAGEAL ECHOCARDIOGRAM (TEE);  Surgeon: Brian Lean, MD;  Location: [facility] ENDOSCOPY;  Service: Cardiovascular;  Laterality: N/A;    Current Medications: Current Meds  Medication Sig   acetaminophen (TYLENOL) 325 MG tablet Take 650 mg by mouth every 6 (six) hours as needed for mild pain, moderate pain or headache.   allopurinol (ZYLOPRIM) 100 MG tablet Take 100 mg by mouth daily.   metoprolol succinate (TOPROL-XL) 25 MG 24 hr tablet Take 25 mg by mouth as needed (for heart rate greater than 110 BPM).   [DISCONTINUED] amiodarone (PACERONE) 200 MG tablet Take 1 tablet (200 mg total) by mouth daily.   [DISCONTINUED] apixaban (ELIQUIS) 5 MG TABS tablet Take 1 tablet (5 mg total) by mouth 2 (two) times daily.   [DISCONTINUED] atorvastatin (LIPITOR) 10 MG tablet Take 10 mg by mouth daily.   [DISCONTINUED] digoxin (LANOXIN) 0.125 MG tablet Take 1 tablet (0.125 mg total) by mouth daily.     Allergies:   Colchicine   Social History   Socioeconomic History   Marital status: Married    Spouse name: Brian Weeks   Number of children: 2   Years of education: Not on file   Highest education level: Not on file  Occupational History   Not on file  Tobacco Use   Smoking status:  Former    Types: Cigarettes   Smokeless tobacco: Never   Tobacco comments:    Former smoker 06/16/2021  Substance and Sexual Activity   Alcohol use: Yes    Alcohol/week: 2.0 - 3.0 standard drinks    Types: 2 - 3 Glasses of wine per week    Comment: 2-3 glasses weekly 06/16/2021   Drug use: Never   Sexual activity: Not on file  Other Topics Concern   Not on file  Social History Narrative   Not on file   Social Determinants of Health   Financial Resource Strain: Not on file  Food Insecurity: Not on file   Transportation Needs: Not on file  Physical Activity: Not on file  Stress: Not on file  Social Connections: Not on file     Family History:  The patient's  family history includes Alzheimer's disease in his mother; Cancer in his brother; Healthy in his brother and sister; Heart disease in his father; Hyperlipidemia in his mother; Hypertension in his mother.   ROS:   Please see the history of present illness.    ROS All other systems reviewed and are negative.   PHYSICAL EXAM:   VS:  BP 110/80   Pulse (!) 108   Ht 5\' 11"  (1.803 m)   Wt 215 lb (97.5 kg)   SpO2 98%   BMI 29.99 kg/m   Physical Exam  GEN: Well nourished, well developed, in no acute distress  Neck: no JVD, carotid bruits, or masses Cardiac:RRR; no murmurs, rubs, or gallops  Respiratory:  clear to auscultation bilaterally, normal work of breathing GI: soft, nontender, nondistended, + BS Ext: without cyanosis, clubbing, or edema, Good distal pulses bilaterally Neuro:  Alert and Oriented x 3, Psych: euthymic mood, full affect  Wt Readings from Last 3 Encounters:  07/01/21 215 lb (97.5 kg)  06/20/21 225 lb 9.6 oz (102.3 kg)  06/16/21 231 lb (104.8 kg)      Studies/Labs Reviewed:   EKG:  EKG is  ordered today.  The ekg ordered today demonstrates Afib with RVR 108/m poor ant R wave progression  Recent Labs: 06/08/2021: TSH 3.281 06/18/2021: B Natriuretic Peptide 1,958.5; Hemoglobin 12.1; Magnesium 2.8; Platelets 248 06/20/2021: ALT 226; BUN 55; Creatinine, Ser 2.22; Potassium 5.2; Sodium 142   Lipid Panel    Component Value Date/Time   CHOL 111 06/09/2021 0152   TRIG 40 06/09/2021 0152   HDL 32 (L) 06/09/2021 0152   CHOLHDL 3.5 06/09/2021 0152   VLDL 8 06/09/2021 0152   LDLCALC 71 06/09/2021 0152    Additional studies/ records that were reviewed today include:  Echo 06/09/21 demonstrated   1. Left ventricular ejection fraction, by estimation, is 20 to 25%. The  left ventricle has severely decreased  function. The left ventricle  demonstrates global hypokinesis. The left ventricular internal cavity size was mildly dilated. Left ventricular diastolic function could not be evaluated.   2. Right ventricular systolic function is moderately reduced. The right  ventricular size is normal. There is moderately elevated pulmonary artery systolic pressure. The estimated right ventricular systolic pressure is 17.5 mmHg.   3. Left atrial size was mildly dilated.   4. Right atrial size was mildly dilated.   5. The mitral valve is grossly normal. Mild to moderate mitral valve  regurgitation. No evidence of mitral stenosis.   6. Tricuspid valve regurgitation is moderate.   7. The aortic valve is tricuspid. Aortic valve regurgitation is not  visualized. No aortic stenosis is present.  8. The inferior vena cava is dilated in size with <50% respiratory  variability, suggesting right atrial pressure of 15 mmHg.      Risk Assessment/Calculations:    CHA2DS2-VASc Score = 2   This indicates a 2.2% annual risk of stroke. The patient's score is based upon: CHF History: 1 HTN History: 0 Diabetes History: 0 Stroke History: 0 Vascular Disease History: 0 Age Score: 1 Gender Score: 0        ASSESSMENT:    1. NICM (nonischemic cardiomyopathy) ([place])   2. Persistent atrial fibrillation ([place])   3. Thrombus of left atrial appendage   4. Stage 3 chronic kidney disease, unspecified whether stage 3a or 3b CKD ([place])   5. Suspected sleep apnea      PLAN:  In order of problems listed above:  NICM Cardiomyopathy possibly tachycardia mediated EF 20 to 25% on echo 06/09/2021-compensated today. Struggling with diet-will give 2 gm sodium diet and reschedule dietician appt.  Atrial fibrillation with RVR on amiodarone, digoxin and metoprolol but became bradycardic at recent hospitalization for AKI and beta-blocker stopped.  On Eliquis- will most likely need TEE cardioversion because of left atrial appendage  thrombus. Was on Toprol XL 75 mg which dropped BP and HR too much. Rate fast today. Can take 25 mg if needed for HR over 110/m-refer back to Afib clinic  Possible left atrial appendage thrombus on Eliquis  CKD with recent AKI hospitalization likely secondary to intermittent hypotension with bradycardia improved quickly creatinine up to 4.58 creatinine 2.2 at discharge 06/20/2021. Recheck today.   Suspected OSA-for sleep study.  Shared Decision Making/Informed Consent        Medication Adjustments/Labs and Tests Ordered: Current medicines are reviewed at length with the patient today.  Concerns regarding medicines are outlined above.  Medication changes, Labs and Tests ordered today are listed in the Patient Instructions below. Patient Instructions  Medication Instructions:  1.Take metoprolol succinate (Toprol XL) 25 mg if your heart rate is over 110 BPM *If you need a refill on your cardiac medications before your next appointment, please call your pharmacy*   Lab Work: BMET today If you have labs (blood work) drawn today and your tests are completely normal, you will receive your results only by: [place] (if you have MyChart) OR A paper copy in the mail If you have any lab test that is abnormal or we need to change your treatment, we will call you to review the results.   Follow-Up: At [facility], you and your health needs are our priority.  As part of our continuing mission to provide you with exceptional heart care, we have created designated Provider Care Teams.  These Care Teams include your primary Cardiologist (physician) and Advanced Practice Providers (APPs -  Physician Assistants and Nurse Practitioners) who all work together to provide you with the care you need, when you need it.  We recommend signing up for the patient portal called "MyChart".  Sign up information is provided on this After Visit Summary.  MyChart is used to connect with patients for Virtual  Visits (Telemedicine).  Patients are able to view lab/test results, encounter notes, upcoming appointments, etc.  Non-urgent messages can be sent to your provider as well.   To learn more about what you can do with MyChart, go to [email or web address].    Your next appointment:   07/06/21 at 3:30PM  The format for your next appointment:   In Person  Provider:   You will follow up in  the Atrial Fibrillation Clinic located at [facility]. Your provider will be: Clint R. Fenton, PA-C{   Other Instructions Two Gram Sodium Diet 2000 mg  What is Sodium? Sodium is a mineral found naturally in many foods. The most significant source of sodium in the diet is table salt, which is about 40% sodium.  Processed, convenience, and preserved foods also contain a large amount of sodium.  The body needs only 500 mg of sodium daily to function,  A normal diet provides more than enough sodium even if you do not use salt.  Why Limit Sodium? A build up of sodium in the body can cause thirst, increased blood pressure, shortness of breath, and water retention.  Decreasing sodium in the diet can reduce edema and risk of heart attack or stroke associated with high blood pressure.  Keep in mind that there are many other factors involved in these health problems.  Heredity, obesity, lack of exercise, cigarette smoking, stress and what you eat all play a role.  General Guidelines: Do not add salt at the table or in cooking.  One teaspoon of salt contains over 2 grams of sodium. Read food labels Avoid processed and convenience foods Ask your dietitian before eating any foods not dicussed in the menu planning guidelines Consult your physician if you wish to use a salt substitute or a sodium containing medication such as antacids.  Limit milk and milk products to 16 oz (2 cups) per day.  Shopping Hints: READ LABELS!! "Dietetic" does not necessarily mean low sodium. Salt and other sodium ingredients are  often added to foods during processing.    Menu Planning Guidelines Food Group Choose More Often Avoid  Beverages (see also the milk group All fruit juices, low-sodium, salt-free vegetables juices, low-sodium carbonated beverages Regular vegetable or tomato juices, commercially softened water used for drinking or cooking  Breads and Cereals Enriched white, wheat, rye and pumpernickel bread, hard rolls and dinner rolls; muffins, cornbread and waffles; most dry cereals, cooked cereal without added salt; unsalted crackers and breadsticks; low sodium or homemade bread crumbs Bread, rolls and crackers with salted tops; quick breads; instant hot cereals; pancakes; commercial bread stuffing; self-rising flower and biscuit mixes; regular bread crumbs or cracker crumbs  Desserts and Sweets Desserts and sweets mad with mild should be within allowance Instant pudding mixes and cake mixes  Fats Butter or margarine; vegetable oils; unsalted salad dressings, regular salad dressings limited to 1 Tbs; light, sour and heavy cream Regular salad dressings containing bacon fat, bacon bits, and salt pork; snack dips made with instant soup mixes or processed cheese; salted nuts  Fruits Most fresh, frozen and canned fruits Fruits processed with salt or sodium-containing ingredient (some dried fruits are processed with sodium sulfites        Vegetables Fresh, frozen vegetables and low- sodium canned vegetables Regular canned vegetables, sauerkraut, pickled vegetables, and others prepared in brine; frozen vegetables in sauces; vegetables seasoned with ham, bacon or salt pork  Condiments, Sauces, Miscellaneous  Salt substitute with physician's approval; pepper, herbs, spices; vinegar, lemon or lime juice; hot pepper sauce; garlic powder, onion powder, low sodium soy sauce (1 Tbs.); low sodium condiments (ketchup, chili sauce, mustard) in limited amounts (1 tsp.) fresh ground horseradish; unsalted tortilla chips, pretzels,  potato chips, popcorn, salsa (1/4 cup) Any seasoning made with salt including garlic salt, celery salt, onion salt, and seasoned salt; sea salt, rock salt, kosher salt; meat tenderizers; monosodium glutamate; mustard, regular soy sauce, barbecue, sauce,  chili sauce, teriyaki sauce, steak sauce, Worcestershire sauce, and most flavored vinegars; canned gravy and mixes; regular condiments; salted snack foods, olives, picles, relish, horseradish sauce, catsup   Food preparation: Try these seasonings Meats:    Pork Sage, onion Serve with applesauce  Chicken Poultry seasoning, thyme, parsley Serve with cranberry sauce  Lamb Curry powder, rosemary, garlic, thyme Serve with mint sauce or jelly  Veal Marjoram, basil Serve with current jelly, cranberry sauce  Beef Pepper, bay leaf Serve with dry mustard, unsalted chive butter  Fish [place] leaf, dill Serve with unsalted lemon butter, unsalted parsley butter  Vegetables:    Asparagus Lemon juice   Broccoli Lemon juice   Carrots Mustard dressing parsley, mint, nutmeg, glazed with unsalted butter and sugar   Green beans Marjoram, lemon juice, nutmeg,dill seed   Tomatoes Basil, marjoram, onion   Spice /blend for [employer]" 4 tsp ground thyme 1 tsp ground sage 3 tsp ground rosemary 4 tsp ground marjoram   Test your knowledge A product that says "Salt Free" may still contain sodium. True or False Garlic Powder and Hot Pepper Sauce an be used as alternative seasonings.True or False Processed foods have more sodium than fresh foods.  True or False Canned Vegetables have less sodium than froze True or False   WAYS TO DECREASE YOUR SODIUM INTAKE Avoid the use of added salt in cooking and at the table.  Table salt (and other prepared seasonings which contain salt) is probably one of the greatest sources of sodium in the diet.  Unsalted foods can gain flavor from the sweet, sour, and butter taste sensations of herbs and spices.  Instead of using salt for  seasoning, try the following seasonings with the foods listed.  Remember: how you use them to enhance natural food flavors is limited only by your creativity... Allspice-Meat, fish, eggs, fruit, peas, red and yellow vegetables Almond Extract-Fruit baked goods Anise Seed-Sweet breads, fruit, carrots, beets, cottage cheese, cookies (tastes like licorice) Basil-Meat, fish, eggs, vegetables, rice, vegetables salads, soups, sauces Bay Leaf-Meat, fish, stews, poultry Burnet-Salad, vegetables (cucumber-like flavor) Caraway Seed-Bread, cookies, cottage cheese, meat, vegetables, cheese, rice Cardamon-Baked goods, fruit, soups Celery Powder or seed-Salads, salad dressings, sauces, meatloaf, soup, bread.Do not use  celery salt Chervil-Meats, salads, fish, eggs, vegetables, cottage cheese (parsley-like flavor) Chili Power-Meatloaf, chicken cheese, corn, eggplant, egg dishes Chives-Salads cottage cheese, egg dishes, soups, vegetables, sauces Cilantro-Salsa, casseroles Cinnamon-Baked goods, fruit, pork, lamb, chicken, carrots Cloves-Fruit, baked goods, fish, pot roast, green beans, beets, carrots Coriander-Pastry, cookies, meat, salads, cheese (lemon-orange flavor) Cumin-Meatloaf, fish,cheese, eggs, cabbage,fruit pie (caraway flavor) [employer], fruit, eggs, fish, poultry, cottage cheese, vegetables Dill Seed-Meat, cottage cheese, poultry, vegetables, fish, salads, bread Fennel Seed-Bread, cookies, apples, pork, eggs, fish, beets, cabbage, cheese, Licorice-like flavor Garlic-(buds or powder) Salads, meat, poultry, fish, bread, butter, vegetables, potatoes.Do not  use garlic salt Ginger-Fruit, vegetables, baked goods, meat, fish, poultry Horseradish Root-Meet, vegetables, butter Lemon Juice or Extract-Vegetables, fruit, tea, baked goods, fish salads Mace-Baked goods fruit, vegetables, fish, poultry (taste like nutmeg) Maple Extract-Syrups Marjoram-Meat, chicken, fish, vegetables, breads, green  salads (taste like Sage) Mint-Tea, lamb, sherbet, vegetables, desserts, carrots, cabbage Mustard, Dry or Seed-Cheese, eggs, meats, vegetables, poultry Nutmeg-Baked goods, fruit, chicken, eggs, vegetables, desserts Onion Powder-Meat, fish, poultry, vegetables, cheese, eggs, bread, rice salads (Do not use   Onion salt) Orange Extract-Desserts, baked goods Oregano-Pasta, eggs, cheese, onions, pork, lamb, fish, chicken, vegetables, green salads Paprika-Meat, fish, poultry, eggs, cheese, vegetables Parsley Flakes-Butter, vegetables, meat fish, poultry, eggs, bread,  salads (certain forms may   Contain sodium Pepper-Meat fish, poultry, vegetables, eggs Peppermint Extract-Desserts, baked goods Poppy Seed-Eggs, bread, cheese, fruit dressings, baked goods, noodles, vegetables, cottage  [employer], poultry, meat, fish, cauliflower, turnips,eggs bread Saffron-Rice, bread, veal, chicken, fish, eggs Sage-Meat, fish, poultry, onions, eggplant, tomateos, pork, stews Savory-Eggs, salads, poultry, meat, rice, vegetables, soups, pork Tarragon-Meat, poultry, fish, eggs, butter, vegetables (licorice-like flavor)  Thyme-Meat, poultry, fish, eggs, vegetables, (clover-like flavor), sauces, soups Tumeric-Salads, butter, eggs, fish, rice, vegetables (saffron-like flavor) Vanilla Extract-Baked goods, candy Vinegar-Salads, vegetables, meat marinades Walnut Extract-baked goods, candy   2. Choose your Foods Wisely   The following is a list of foods to avoid which are high in sodium:  Meats-Avoid all smoked, canned, salt cured, dried and kosher meat and fish as well as Anchovies   [employer] meats:Bologna, Liverwurst, Pastrami Canned meat or fish  Marinated herring Caviar    Pepperoni Corned Beef   Pizza Dried chipped beef  Salami Frozen breaded fish or meat Salt pork Frankfurters or hot dogs  Sardines Gefilte fish   Sausage Ham (boiled ham,  Proscuitto Smoked butt    spiced ham)   Spam      TV Dinners Vegetables Canned vegetables (Regular) Relish Canned mushrooms  Sauerkraut Olives    Tomato juice Pickles  Bakery and Dessert Products Canned puddings  Cream pies Cheesecake   Decorated cakes Cookies  Beverages/Juices Tomato juice, regular  Gatorade   V-8 vegetable juice, regular  Breads and Cereals Biscuit mixes   Salted potato chips, corn chips, pretzels Bread stuffing mixes  Salted crackers and rolls Pancake and waffle mixes Self-rising flour  Seasonings Accent    Meat sauces Barbecue sauce  Meat tenderizer Catsup    Monosodium glutamate (MSG) Celery salt   Onion salt Chili sauce   Prepared mustard Garlic salt   Salt, seasoned salt, sea salt Gravy mixes   Soy sauce Horseradish   Steak sauce Ketchup   Tartar sauce Lite salt    Teriyaki sauce Marinade mixes   Worcestershire sauce  Others Baking powder   Cocoa and cocoa mixes Baking soda   Commercial casserole mixes Candy-caramels, chocolate  Dehydrated soups    Bars, fudge,nougats  Instant rice and pasta mixes Canned broth or soup  Maraschino cherries Cheese, aged and processed cheese and cheese spreads  Learning Assessment Quiz  Indicated T (for True) or F (for False) for each of the following statements:  _____ Fresh fruits and vegetables and unprocessed grains are generally low in sodium _____ Water may contain a considerable amount of sodium, depending on the source _____ You can always tell if a food is high in sodium by tasting it _____ Certain laxatives my be high in sodium and should be avoided unless prescribed   by a physician or pharmacist _____ Salt substitutes may be used freely by anyone on a sodium restricted diet _____ Sodium is present in table salt, food additives and as a natural component of   most foods _____ Table salt is approximately 90% sodium _____ Limiting sodium intake may help prevent excess fluid accumulation in the  body _____ On a sodium-restricted diet, seasonings such as bouillon soy sauce, and    cooking wine should be used in place of table salt _____ On an ingredient list, a product which lists monosodium glutamate as the first   ingredient is an appropriate food to include on a low sodium diet  Circle the best answer(s) to the following  statements (Hint: there may be more than one correct answer)  11. On a low-sodium diet, some acceptable snack items are:    A. Olives  F. Bean dip   K. Grapefruit juice    B. Salted Pretzels G. Commercial Popcorn   L. Canned peaches    C. Carrot Sticks  H. Bouillon   M. Unsalted nuts   D. [place] fries  I. Peanut butter crackers N. Salami   E. Sweet pickles J. Tomato Juice   O. Pizza  12.  Seasonings that may be used freely on a reduced - sodium diet include   A. Lemon wedges F.Monosodium glutamate K. Celery seed    B.Soysauce   G. Pepper   L. Mustard powder   C. Sea salt  H. Cooking wine  M. Onion flakes   D. Vinegar  E. Prepared horseradish N. Salsa   E. Sage   J. Worcestershire sauce  O. [address]     Sumner Boast, PA-C  07/01/2021 12:13 PM    [facility] [place], [place], [place]  [address] Phone: [phone]; Fax: [phone]

## 2021-06-26 ENCOUNTER — Telehealth (HOSPITAL_COMMUNITY): Payer: Self-pay | Admitting: Pharmacist

## 2021-06-26 ENCOUNTER — Other Ambulatory Visit (HOSPITAL_COMMUNITY): Payer: Self-pay

## 2021-06-26 NOTE — Telephone Encounter (Signed)
Pharmacy Transitions of Care Follow-up Telephone Call   Date of discharge: 06/15/21 Discharge Diagnosis:   Atrial fibrillation with RVR    How have you been since you were released from the hospital? Patient states that he is feeling OK, except some issues with gout in his hand.    Of note, patient was hospitalized 11/3-11/5 for acute kidney injury superimposed on CKD     Medication changes made at discharge:  - START: amiodarone, digoxin, toprol (later d/c at 11/3-5 hospitalization), Eliquis     Medication changes verified by the patient? yes     Medication Accessibility:   Home Pharmacy: [facility] - [facility] term; [facility] for home deliver    Was the patient provided with refills on discharged medications? no    Have all prescriptions been transferred from [facility] to home pharmacy? N/a    Is the patient able to afford medications? Not sure   Notable copays: Eliquis $126.23 due to deductible not met.Instructed patient to call [employer] [facility]) to see what mail order copay would be. If the cost is not manageable, instructed patient to bring it up at next cardiology appointment.        Medication Review:     APIXABAN (ELIQUIS)  Apixaban 5 mg BID initiated on 06/09/21 - Discussed importance of taking medication around the same time everyday  - Reviewed potential DDIs with patient  - Advised patient of medications to avoid (NSAIDs, ASA)  - Educated that Tylenol (acetaminophen) will be the preferred analgesic to prevent risk of bleeding  - Emphasized importance of monitoring for signs and symptoms of bleeding (abnormal bruising, prolonged bleeding, nose bleeds, bleeding from gums, discolored urine, black tarry stools)  - Advised patient to alert all providers of anticoagulation therapy prior to starting a new medication or having a procedure                Follow-up Appointments:   PCP Hospital f/u appt confirmed?  Saw Jillyn Ledger with [facility] 06/16/21   [facility]  f/u appt confirmed? Yes  Scheduled to see cardiology on 07/01/21 @ 9:45 AM. Also saw cardiology 06/16/21   If their condition worsens, is the pt aware to call PCP or go to the Emergency Dept.? yes   Final Patient Assessment: Patient is tolerating Eliquis and has not shown any s/sx of bleeding or bruising. Answered patient's questions regarding medications.   Brian Weeks Brian Weeks, PharmD, BCPS, [place]  [phone]

## 2021-06-29 DIAGNOSIS — G8929 Other chronic pain: Secondary | ICD-10-CM | POA: Diagnosis not present

## 2021-06-29 DIAGNOSIS — E782 Mixed hyperlipidemia: Secondary | ICD-10-CM | POA: Diagnosis not present

## 2021-06-29 DIAGNOSIS — N1831 Chronic kidney disease, stage 3a: Secondary | ICD-10-CM | POA: Diagnosis not present

## 2021-06-29 DIAGNOSIS — I509 Heart failure, unspecified: Secondary | ICD-10-CM | POA: Diagnosis not present

## 2021-06-29 DIAGNOSIS — I4891 Unspecified atrial fibrillation: Secondary | ICD-10-CM | POA: Diagnosis not present

## 2021-07-01 ENCOUNTER — Ambulatory Visit: Payer: Medicare Other | Admitting: Physician Assistant

## 2021-07-01 ENCOUNTER — Encounter: Payer: Self-pay | Admitting: Physician Assistant

## 2021-07-01 ENCOUNTER — Other Ambulatory Visit: Payer: Self-pay

## 2021-07-01 VITALS — BP 110/80 | HR 108 | Ht 71.0 in | Wt 215.0 lb

## 2021-07-01 DIAGNOSIS — I428 Other cardiomyopathies: Secondary | ICD-10-CM

## 2021-07-01 DIAGNOSIS — I4819 Other persistent atrial fibrillation: Secondary | ICD-10-CM | POA: Diagnosis not present

## 2021-07-01 DIAGNOSIS — R29818 Other symptoms and signs involving the nervous system: Secondary | ICD-10-CM | POA: Diagnosis not present

## 2021-07-01 DIAGNOSIS — I513 Intracardiac thrombosis, not elsewhere classified: Secondary | ICD-10-CM

## 2021-07-01 DIAGNOSIS — N183 Chronic kidney disease, stage 3 unspecified: Secondary | ICD-10-CM | POA: Diagnosis not present

## 2021-07-01 LAB — BASIC METABOLIC PANEL
BUN/Creatinine Ratio: 21 (ref 10–24)
BUN: 31 mg/dL — ABNORMAL HIGH (ref 8–27)
CO2: 24 mmol/L (ref 20–29)
Calcium: 9.3 mg/dL (ref 8.6–10.2)
Chloride: 105 mmol/L (ref 96–106)
Creatinine, Ser: 1.5 mg/dL — ABNORMAL HIGH (ref 0.76–1.27)
Glucose: 85 mg/dL (ref 70–99)
Potassium: 5.1 mmol/L (ref 3.5–5.2)
Sodium: 143 mmol/L (ref 134–144)
eGFR: 50 mL/min/{1.73_m2} — ABNORMAL LOW (ref >59)

## 2021-07-01 MED ORDER — APIXABAN 5 MG PO TABS: 5.0000 mg | ORAL_TABLET | Freq: Two times a day (BID) | ORAL | 3 refills | Status: DC

## 2021-07-01 MED ORDER — DIGOXIN 125 MCG PO TABS: 0.1250 mg | ORAL_TABLET | Freq: Every day | ORAL | 3 refills | Status: DC

## 2021-07-01 MED ORDER — ATORVASTATIN CALCIUM 10 MG PO TABS: 10.0000 mg | ORAL_TABLET | Freq: Every day | ORAL | 3 refills | Status: DC

## 2021-07-01 MED ORDER — AMIODARONE HCL 200 MG PO TABS: 200.0000 mg | ORAL_TABLET | Freq: Every day | ORAL | 3 refills | Status: DC

## 2021-07-01 NOTE — Patient Instructions (Signed)
Medication Instructions:  1.Take metoprolol succinate (Toprol XL) 25 mg if your heart rate is over 110 BPM *If you need a refill on your cardiac medications before your next appointment, please call your pharmacy*   Lab Work: BMET today If you have labs (blood work) drawn today and your tests are completely normal, you will receive your results only by: [place] (if you have MyChart) OR A paper copy in the mail If you have any lab test that is abnormal or we need to change your treatment, we will call you to review the results.   Follow-Up: At [facility], you and your health needs are our priority.  As part of our continuing mission to provide you with exceptional heart care, we have created designated Provider Care Teams.  These Care Teams include your primary Cardiologist (physician) and Advanced Practice Providers (APPs -  Physician Assistants and Nurse Practitioners) who all work together to provide you with the care you need, when you need it.  We recommend signing up for the patient portal called "MyChart".  Sign up information is provided on this After Visit Summary.  MyChart is used to connect with patients for Virtual Visits (Telemedicine).  Patients are able to view lab/test results, encounter notes, upcoming appointments, etc.  Non-urgent messages can be sent to your provider as well.   To learn more about what you can do with MyChart, go to [email or web address].    Your next appointment:   07/06/21 at 3:30PM  The format for your next appointment:   In Person  Provider:   You will follow up in the [facility] located at [facility]. Your provider will be: Clint R. Fenton, PA-C{   Other Instructions Two Gram Sodium Diet 2000 mg  What is Sodium? Sodium is a mineral found naturally in many foods. The most significant source of sodium in the diet is table salt, which is about 40% sodium.  Processed, convenience, and preserved foods also  contain a large amount of sodium.  The body needs only 500 mg of sodium daily to function,  A normal diet provides more than enough sodium even if you do not use salt.  Why Limit Sodium? A build up of sodium in the body can cause thirst, increased blood pressure, shortness of breath, and water retention.  Decreasing sodium in the diet can reduce edema and risk of heart attack or stroke associated with high blood pressure.  Keep in mind that there are many other factors involved in these health problems.  Heredity, obesity, lack of exercise, cigarette smoking, stress and what you eat all play a role.  General Guidelines: Do not add salt at the table or in cooking.  One teaspoon of salt contains over 2 grams of sodium. Read food labels Avoid processed and convenience foods Ask your dietitian before eating any foods not dicussed in the menu planning guidelines Consult your physician if you wish to use a salt substitute or a sodium containing medication such as antacids.  Limit milk and milk products to 16 oz (2 cups) per day.  Shopping Hints: READ LABELS!! "Dietetic" does not necessarily mean low sodium. Salt and other sodium ingredients are often added to foods during processing.    Menu Planning Guidelines Food Group Choose More Often Avoid  Beverages (see also the milk group All fruit juices, low-sodium, salt-free vegetables juices, low-sodium carbonated beverages Regular vegetable or tomato juices, commercially softened water used for drinking or cooking  Breads and Cereals Enriched  white, wheat, rye and pumpernickel bread, hard rolls and dinner rolls; muffins, cornbread and waffles; most dry cereals, cooked cereal without added salt; unsalted crackers and breadsticks; low sodium or homemade bread crumbs Bread, rolls and crackers with salted tops; quick breads; instant hot cereals; pancakes; commercial bread stuffing; self-rising flower and biscuit mixes; regular bread crumbs or cracker crumbs   Desserts and Sweets Desserts and sweets mad with mild should be within allowance Instant pudding mixes and cake mixes  Fats Butter or margarine; vegetable oils; unsalted salad dressings, regular salad dressings limited to 1 Tbs; light, sour and heavy cream Regular salad dressings containing bacon fat, bacon bits, and salt pork; snack dips made with instant soup mixes or processed cheese; salted nuts  Fruits Most fresh, frozen and canned fruits Fruits processed with salt or sodium-containing ingredient (some dried fruits are processed with sodium sulfites        Vegetables Fresh, frozen vegetables and low- sodium canned vegetables Regular canned vegetables, sauerkraut, pickled vegetables, and others prepared in brine; frozen vegetables in sauces; vegetables seasoned with ham, bacon or salt pork  Condiments, Sauces, Miscellaneous  Salt substitute with physician's approval; pepper, herbs, spices; vinegar, lemon or lime juice; hot pepper sauce; garlic powder, onion powder, low sodium soy sauce (1 Tbs.); low sodium condiments (ketchup, chili sauce, mustard) in limited amounts (1 tsp.) fresh ground horseradish; unsalted tortilla chips, pretzels, potato chips, popcorn, salsa (1/4 cup) Any seasoning made with salt including garlic salt, celery salt, onion salt, and seasoned salt; sea salt, rock salt, kosher salt; meat tenderizers; monosodium glutamate; mustard, regular soy sauce, barbecue, sauce, chili sauce, teriyaki sauce, steak sauce, Worcestershire sauce, and most flavored vinegars; canned gravy and mixes; regular condiments; salted snack foods, olives, picles, relish, horseradish sauce, catsup   Food preparation: Try these seasonings Meats:    Pork Sage, onion Serve with applesauce  Chicken Poultry seasoning, thyme, parsley Serve with cranberry sauce  Lamb Curry powder, rosemary, garlic, thyme Serve with mint sauce or jelly  Veal Marjoram, basil Serve with current jelly, cranberry sauce  Beef  Pepper, bay leaf Serve with dry mustard, unsalted chive butter  Fish [place] leaf, dill Serve with unsalted lemon butter, unsalted parsley butter  Vegetables:    Asparagus Lemon juice   Broccoli Lemon juice   Carrots Mustard dressing parsley, mint, nutmeg, glazed with unsalted butter and sugar   Green beans Marjoram, lemon juice, nutmeg,dill seed   Tomatoes Basil, marjoram, onion   Spice /blend for [employer]" 4 tsp ground thyme 1 tsp ground sage 3 tsp ground rosemary 4 tsp ground marjoram   Test your knowledge A product that says "Salt Free" may still contain sodium. True or False Garlic Powder and Hot Pepper Sauce an be used as alternative seasonings.True or False Processed foods have more sodium than fresh foods.  True or False Canned Vegetables have less sodium than froze True or False   WAYS TO DECREASE YOUR SODIUM INTAKE Avoid the use of added salt in cooking and at the table.  Table salt (and other prepared seasonings which contain salt) is probably one of the greatest sources of sodium in the diet.  Unsalted foods can gain flavor from the sweet, sour, and butter taste sensations of herbs and spices.  Instead of using salt for seasoning, try the following seasonings with the foods listed.  Remember: how you use them to enhance natural food flavors is limited only by your creativity... Allspice-Meat, fish, eggs, fruit, peas, red and yellow vegetables Almond Extract-Fruit  baked goods Anise Seed-Sweet breads, fruit, carrots, beets, cottage cheese, cookies (tastes like licorice) Basil-Meat, fish, eggs, vegetables, rice, vegetables salads, soups, sauces Bay Leaf-Meat, fish, stews, poultry Burnet-Salad, vegetables (cucumber-like flavor) Caraway Seed-Bread, cookies, cottage cheese, meat, vegetables, cheese, rice Cardamon-Baked goods, fruit, soups Celery Powder or seed-Salads, salad dressings, sauces, meatloaf, soup, bread.Do not use  celery salt Chervil-Meats, salads, fish, eggs,  vegetables, cottage cheese (parsley-like flavor) Chili Power-Meatloaf, chicken cheese, corn, eggplant, egg dishes Chives-Salads cottage cheese, egg dishes, soups, vegetables, sauces Cilantro-Salsa, casseroles Cinnamon-Baked goods, fruit, pork, lamb, chicken, carrots Cloves-Fruit, baked goods, fish, pot roast, green beans, beets, carrots Coriander-Pastry, cookies, meat, salads, cheese (lemon-orange flavor) Cumin-Meatloaf, fish,cheese, eggs, cabbage,fruit pie (caraway flavor) [employer], fruit, eggs, fish, poultry, cottage cheese, vegetables Dill Seed-Meat, cottage cheese, poultry, vegetables, fish, salads, bread Fennel Seed-Bread, cookies, apples, pork, eggs, fish, beets, cabbage, cheese, Licorice-like flavor Garlic-(buds or powder) Salads, meat, poultry, fish, bread, butter, vegetables, potatoes.Do not  use garlic salt Ginger-Fruit, vegetables, baked goods, meat, fish, poultry Horseradish Root-Meet, vegetables, butter Lemon Juice or Extract-Vegetables, fruit, tea, baked goods, fish salads Mace-Baked goods fruit, vegetables, fish, poultry (taste like nutmeg) Maple Extract-Syrups Marjoram-Meat, chicken, fish, vegetables, breads, green salads (taste like Sage) Mint-Tea, lamb, sherbet, vegetables, desserts, carrots, cabbage Mustard, Dry or Seed-Cheese, eggs, meats, vegetables, poultry Nutmeg-Baked goods, fruit, chicken, eggs, vegetables, desserts Onion Powder-Meat, fish, poultry, vegetables, cheese, eggs, bread, rice salads (Do not use   Onion salt) Orange Extract-Desserts, baked goods Oregano-Pasta, eggs, cheese, onions, pork, lamb, fish, chicken, vegetables, green salads Paprika-Meat, fish, poultry, eggs, cheese, vegetables Parsley Flakes-Butter, vegetables, meat fish, poultry, eggs, bread, salads (certain forms may   Contain sodium Pepper-Meat fish, poultry, vegetables, eggs Peppermint Extract-Desserts, baked goods Poppy Seed-Eggs, bread, cheese, fruit dressings, baked goods,  noodles, vegetables, cottage  [employer], poultry, meat, fish, cauliflower, turnips,eggs bread Saffron-Rice, bread, veal, chicken, fish, eggs Sage-Meat, fish, poultry, onions, eggplant, tomateos, pork, stews Savory-Eggs, salads, poultry, meat, rice, vegetables, soups, pork Tarragon-Meat, poultry, fish, eggs, butter, vegetables (licorice-like flavor)  Thyme-Meat, poultry, fish, eggs, vegetables, (clover-like flavor), sauces, soups Tumeric-Salads, butter, eggs, fish, rice, vegetables (saffron-like flavor) Vanilla Extract-Baked goods, candy Vinegar-Salads, vegetables, meat marinades Walnut Extract-baked goods, candy   2. Choose your Foods Wisely   The following is a list of foods to avoid which are high in sodium:  Meats-Avoid all smoked, canned, salt cured, dried and kosher meat and fish as well as Anchovies   [employer] meats:Bologna, Liverwurst, Pastrami Canned meat or fish  Marinated herring Caviar    Pepperoni Corned Beef   Pizza Dried chipped beef  Salami Frozen breaded fish or meat Salt pork Frankfurters or hot dogs  Sardines Gefilte fish   Sausage Ham (boiled ham, Proscuitto Smoked butt    spiced ham)   Spam      TV Dinners Vegetables Canned vegetables (Regular) Relish Canned mushrooms  Sauerkraut Olives    Tomato juice Pickles  Bakery and Dessert Products Canned puddings  Cream pies Cheesecake   Decorated cakes Cookies  Beverages/Juices Tomato juice, regular  Gatorade   V-8 vegetable juice, regular  Breads and Cereals Biscuit mixes   Salted potato chips, corn chips, pretzels Bread stuffing mixes  Salted crackers and rolls Pancake and waffle mixes Self-rising flour  Seasonings Accent    Meat sauces Barbecue sauce  Meat tenderizer Catsup    Monosodium glutamate (MSG) Celery salt   Onion salt Chili sauce   Prepared mustard Garlic salt   Salt, seasoned salt, sea  salt Gravy mixes   Soy  sauce Horseradish   Steak sauce Ketchup   Tartar sauce Lite salt    Teriyaki sauce Marinade mixes   Worcestershire sauce  Others Baking powder   Cocoa and cocoa mixes Baking soda   Commercial casserole mixes Candy-caramels, chocolate  Dehydrated soups    Bars, fudge,nougats  Instant rice and pasta mixes Canned broth or soup  Maraschino cherries Cheese, aged and processed cheese and cheese spreads  Learning Assessment Quiz  Indicated T (for True) or F (for False) for each of the following statements:  _____ Fresh fruits and vegetables and unprocessed grains are generally low in sodium _____ Water may contain a considerable amount of sodium, depending on the source _____ You can always tell if a food is high in sodium by tasting it _____ Certain laxatives my be high in sodium and should be avoided unless prescribed   by a physician or pharmacist _____ Salt substitutes may be used freely by anyone on a sodium restricted diet _____ Sodium is present in table salt, food additives and as a natural component of   most foods _____ Table salt is approximately 90% sodium _____ Limiting sodium intake may help prevent excess fluid accumulation in the body _____ On a sodium-restricted diet, seasonings such as bouillon soy sauce, and    cooking wine should be used in place of table salt _____ On an ingredient list, a product which lists monosodium glutamate as the first   ingredient is an appropriate food to include on a low sodium diet  Circle the best answer(s) to the following statements (Hint: there may be more than one correct answer)  11. On a low-sodium diet, some acceptable snack items are:    A. Olives  F. Bean dip   K. Grapefruit juice    B. Salted Pretzels G. Commercial Popcorn   L. Canned peaches    C. Carrot Sticks  H. Bouillon   M. Unsalted nuts   D. [place] fries  I. Peanut butter crackers N. Salami   E. Sweet pickles J. Tomato Juice   O. Pizza  12.  Seasonings that may be  used freely on a reduced - sodium diet include   A. Lemon wedges F.Monosodium glutamate K. Celery seed    B.Soysauce   G. Pepper   L. Mustard powder   C. Sea salt  H. Cooking wine  M. Onion flakes   D. Vinegar  E. Prepared horseradish N. Salsa   E. Sage   J. Worcestershire sauce  O. Chutney

## 2021-07-06 ENCOUNTER — Telehealth: Payer: Self-pay

## 2021-07-06 ENCOUNTER — Ambulatory Visit (HOSPITAL_COMMUNITY)
Admission: RE | Admit: 2021-07-06 | Discharge: 2021-07-06 | Disposition: A | Discharge status: Home / Self Care | Payer: Medicare Other | Source: Ambulatory Visit | Attending: Physician Assistant | Admitting: Physician Assistant

## 2021-07-06 ENCOUNTER — Encounter (HOSPITAL_COMMUNITY): Payer: Self-pay | Admitting: Physician Assistant

## 2021-07-06 VITALS — BP 122/74 | HR 68 | Ht 71.0 in | Wt 214.0 lb

## 2021-07-06 DIAGNOSIS — D6869 Other thrombophilia: Secondary | ICD-10-CM | POA: Diagnosis not present

## 2021-07-06 DIAGNOSIS — I4819 Other persistent atrial fibrillation: Secondary | ICD-10-CM | POA: Insufficient documentation

## 2021-07-06 DIAGNOSIS — I480 Paroxysmal atrial fibrillation: Secondary | ICD-10-CM

## 2021-07-06 NOTE — Telephone Encounter (Signed)
Based on documentation continue digoxin and Amiodarone. Called and notified [facility].  From last OV:  " Patient was started on amiodarone and to follow-up in the lipid clinic.  Also started on digoxin pending renal level and Toprol."  "Patient was seen in [facility] fib clinic 06/16/2021 and was to continue amiodarone 400 mg twice daily until 06/21/2021 and then decrease to 200 mg daily.  Was also continued on digoxin 0.125 mg Eliquis and Toprol."  "Atrial fibrillation with RVR on amiodarone, digoxin and metoprolol but became bradycardic at recent hospitalization for AKI and beta-blocker stopped. Can take 25 mg if needed for HR over 110/m"

## 2021-07-06 NOTE — Telephone Encounter (Signed)
[facility] mail order pharmacy requesting clarification because pt is concurrent use of amiodarone 200 mg and digoxin 0.125, may cause toxicity characterized by gastrointestinal and neuropsychiatric symptoms may occur. Cardiac arrhythmias are possible. Digoxin may have side effects increased by Amiodarone. Is the provider aware and does provider still want to prescribed? Please address

## 2021-07-06 NOTE — Progress Notes (Signed)
Primary Care Physician: Kristen Loader, FNP Primary Cardiologist: Dr Burt Knack Primary Electrophysiologist: none Referring Physician: Dr Lidia Collum is a 70 y.o. male with a history of CKD, HLD, HFrEF, atrial fibrillation who presents for follow up in the [facility]. Patient presented to his PCP with worsening SOB and palpitations for one month. He was found to be in afib with RVR and was sent to the ED. ECG showed afib with rapid rates. Echo revealed EF 20-25%. He was started on Eliquis for a CHADS2VASC score of 2. Plan was for TEE guided DCCV but TEE showed heavy smoke and concern for thrombus. He was loaded on amiodarone and started on metoprolol and digoxin for rate control. He had converted to SR by his hospital follow up visit. He denies alcohol use but does admit to snoring and witnessed apnea (wife confirms). Patient readmitted to the hospital 06/18/2021 with AKI with Crt 4.58.  He became bradycardic and hypotensive and held his beta-blocker in the hospital.  Also had elevated liver function tests possibly secondary to hypotensive episode with mild ischemic liver injury. Crt 2.22 at discharge.  On follow up today, patient reports that most mornings he wakes with a heart rate ~ 100-110 bpm. The slows down as the day goes on. He was seen on 07/01/21 by Ermalinda Barrios and was in afib. He is back in SR today.   Today, he denies symptoms of chest pain, orthopnea, PND, lower extremity edema, dizziness, presyncope, syncope, bleeding, or neurologic sequela. The patient is tolerating medications without difficulties and is otherwise without complaint today.    Atrial Fibrillation Risk Factors:  he does have symptoms or diagnosis of sleep apnea. he does not have a history of rheumatic fever. he does not have a history of alcohol use. The patient does not have a history of early familial atrial fibrillation or other arrhythmias.  he has a BMI of Body  mass index is 29.85 kg/m.Marland Kitchen Filed Weights   07/06/21 1544  Weight: 97.1 kg     Family History  Problem Relation Age of Onset   Hypertension Mother    Hyperlipidemia Mother    Alzheimer's disease Mother    Heart disease Father    Healthy Sister    Cancer Brother    Healthy Brother      Atrial Fibrillation Management history:  Previous antiarrhythmic drugs: amiodarone  Previous cardioversions: none Previous ablations: none CHADS2VASC score: 2 Anticoagulation history: Eliquis   Past Medical History:  Diagnosis Date   CKD (chronic kidney disease) stage 3, GFR 30-59 ml/min (HCC)    Gout    HLD (hyperlipidemia)    Past Surgical History:  Procedure Laterality Date   TEE WITHOUT CARDIOVERSION N/A 06/10/2021   Procedure: TRANSESOPHAGEAL ECHOCARDIOGRAM (TEE);  Surgeon: Werner Lean, MD;  Location: [facility] ENDOSCOPY;  Service: Cardiovascular;  Laterality: N/A;    Current Outpatient Medications  Medication Sig Dispense Refill   acetaminophen (TYLENOL) 325 MG tablet Take 650 mg by mouth every 6 (six) hours as needed for mild pain, moderate pain or headache.     allopurinol (ZYLOPRIM) 300 MG tablet Take 300 mg by mouth daily.     amiodarone (PACERONE) 200 MG tablet Take 1 tablet (200 mg total) by mouth daily. 90 tablet 3   apixaban (ELIQUIS) 5 MG TABS tablet Take 1 tablet (5 mg total) by mouth 2 (two) times daily. 180 tablet 3   atorvastatin (LIPITOR) 10 MG tablet Take 1 tablet (10  mg total) by mouth daily. 90 tablet 3   digoxin (LANOXIN) 0.125 MG tablet Take 1 tablet (0.125 mg total) by mouth daily. 90 tablet 3   metoprolol succinate (TOPROL-XL) 25 MG 24 hr tablet Take 25 mg by mouth as needed (for heart rate greater than 110 BPM).     Misc Natural Products (OSTEO BI-FLEX ADV DOUBLE ST PO) Take by mouth.     No current facility-administered medications for this encounter.    Allergies  Allergen Reactions   Colchicine     Sweating    Social History    Socioeconomic History   Marital status: Married    Spouse name: [name]   Number of children: 2   Years of education: Not on file   Highest education level: Not on file  Occupational History   Not on file  Tobacco Use   Smoking status: Former    Types: Cigarettes   Smokeless tobacco: Never   Tobacco comments:    Former smoker 06/16/2021  Substance and Sexual Activity   Alcohol use: Yes    Alcohol/week: 2.0 - 3.0 standard drinks    Types: 2 - 3 Glasses of wine per week    Comment: 2-3 glasses weekly 06/16/2021   Drug use: Never   Sexual activity: Not on file  Other Topics Concern   Not on file  Social History Narrative   Not on file   Social Determinants of Health   Financial Resource Strain: Not on file  Food Insecurity: Not on file  Transportation Needs: Not on file  Physical Activity: Not on file  Stress: Not on file  Social Connections: Not on file  Intimate Partner Violence: Not on file     ROS- All systems are reviewed and negative except as per the HPI above.  Physical Exam: Vitals:   07/06/21 1544  BP: 122/74  Pulse: 68  Weight: 97.1 kg  Height: 5\' 11"  (1.803 m)    GEN- The patient is a well appearing male, alert and oriented x 3 today.   HEENT-head normocephalic, atraumatic, sclera clear, conjunctiva pink, hearing intact, trachea midline. Lungs- Clear to ausculation bilaterally, normal work of breathing Heart- Regular rate and rhythm, no murmurs, rubs or gallops  GI- soft, NT, ND, + BS Extremities- no clubbing, cyanosis, or edema MS- no significant deformity or atrophy Skin- no rash or lesion Psych- euthymic mood, full affect Neuro- strength and sensation are intact   Wt Readings from Last 3 Encounters:  07/06/21 97.1 kg  07/01/21 97.5 kg  06/20/21 102.3 kg    EKG today demonstrates  SR Vent. rate 68 BPM PR interval 182 ms QRS duration 86 ms QT/QTcB 382/406 ms  Echo 06/09/21 demonstrated   1. Left ventricular ejection fraction, by  estimation, is 20 to 25%. The  left ventricle has severely decreased function. The left ventricle  demonstrates global hypokinesis. The left ventricular internal cavity size was mildly dilated. Left ventricular diastolic function could not be evaluated.   2. Right ventricular systolic function is moderately reduced. The right  ventricular size is normal. There is moderately elevated pulmonary artery systolic pressure. The estimated right ventricular systolic pressure is 49.6 mmHg.   3. Left atrial size was mildly dilated.   4. Right atrial size was mildly dilated.   5. The mitral valve is grossly normal. Mild to moderate mitral valve  regurgitation. No evidence of mitral stenosis.   6. Tricuspid valve regurgitation is moderate.   7. The aortic valve is tricuspid. Aortic valve  regurgitation is not  visualized. No aortic stenosis is present.   8. The inferior vena cava is dilated in size with <50% respiratory  variability, suggesting right atrial pressure of 15 mmHg.   Epic records are reviewed at length today  CHA2DS2-VASc Score = 2  The patient's score is based upon: CHF History: 1 HTN History: 0 Diabetes History: 0 Stroke History: 0 Vascular Disease History: 0 Age Score: 1 Gender Score: 0       ASSESSMENT AND PLAN: 1. Persistent Atrial Fibrillation (ICD10:  I48.19) The patient's CHA2DS2-VASc score is 2, indicating a 2.2% annual risk of stroke.   Patient in [place] today. Patient having elevated heart rates in the morning on waking. ? Related to undiagnosed OSA. Unclear burden of afib. Will have him wear a 14 day Zio monitor to better assess.  Continue amiodarone 200 mg daily Continue metoprolol 25 mg PRN for elevated heart rate >100 bpm. Continue Eliquis 5 mg BID Continue digoxin 0.125 mg daily  2. Secondary Hypercoagulable State (ICD10:  D68.69) The patient is at significant risk for stroke/thromboembolism based upon his CHA2DS2-VASc Score of 2.  Continue Apixaban (Eliquis).    3. Snoring/witnessed apnea Scheduled for sleep consult next month.   4. HFrEF EF 20-25%, suspected related to afib. Defer necessity of ischemic workup to primary cardiologist. No signs or symptoms of fluid overload.  5. Valvular heart disease Mild-moderate MR, moderate TR   Follow up in the AF clinic in 4 weeks to go over monitor results.    [facility] [address] [place], [place] [address] [phone] 07/06/2021 3:54 PM

## 2021-07-13 ENCOUNTER — Other Ambulatory Visit: Payer: Self-pay | Admitting: Physician Assistant

## 2021-07-13 MED ORDER — DIGOXIN 125 MCG PO TABS: 0.1250 mg | ORAL_TABLET | Freq: Every day | ORAL | 11 refills | Status: DC

## 2021-07-14 NOTE — Telephone Encounter (Deleted)
Pharmacy is calling again stating that there is a interaction between amiodarone and digoxin. Is Dr. Rayann Heman aware of the interaction and would he like the pt to continue taking both? Please address

## 2021-07-14 NOTE — Telephone Encounter (Signed)
[facility] calling confirming that Dr. Rayann Heman is aware of the interaction between digoxin and Amuiofarone. Per note on 07/06/21, Saddie Benders, RN, she had called [facility] mail order pharmacy, but pt wanted medication sent to [facility]. They were called and notified that pt is still on both and to continue medications. Pharmacist verbalized understanding.

## 2021-07-24 DIAGNOSIS — I480 Paroxysmal atrial fibrillation: Secondary | ICD-10-CM | POA: Diagnosis not present

## 2021-08-04 DIAGNOSIS — M25532 Pain in left wrist: Secondary | ICD-10-CM | POA: Diagnosis not present

## 2021-08-05 DIAGNOSIS — I4819 Other persistent atrial fibrillation: Secondary | ICD-10-CM | POA: Diagnosis not present

## 2021-08-05 DIAGNOSIS — R0681 Apnea, not elsewhere classified: Secondary | ICD-10-CM | POA: Diagnosis not present

## 2021-08-05 DIAGNOSIS — R0683 Snoring: Secondary | ICD-10-CM | POA: Diagnosis not present

## 2021-08-11 NOTE — Progress Notes (Signed)
Primary Care Physician: Brian Loader, FNP Primary Cardiologist: Dr Burt Knack Primary Electrophysiologist: none Referring Physician: Dr Lidia Collum is a 71 y.o. male with a history of CKD, HLD, HFrEF, atrial fibrillation who presents for follow up in the [facility]. Patient presented to his PCP with worsening SOB and palpitations for one month. He was found to be in afib with RVR and was sent to the ED. ECG showed afib with rapid rates. Echo revealed EF 20-25%. He was started on Eliquis for a CHADS2VASC score of 2. Plan was for TEE guided DCCV but TEE showed heavy smoke and concern for thrombus. He was loaded on amiodarone and started on metoprolol and digoxin for rate control. He had converted to SR by his hospital follow up visit. He denies alcohol use but does admit to snoring and witnessed apnea (wife confirms). Patient readmitted to the hospital 06/18/2021 with AKI with Crt 4.58.  He became bradycardic and hypotensive and held his beta-blocker in the hospital.  Also had elevated liver function tests possibly secondary to hypotensive episode with mild ischemic liver injury. Crt 2.22 at discharge.  On follow up today, patient reports that he feels well today. His Zio monitor showed 21% afib with avg HR of 109. He is in SR today. He has done well working on lifestyle modification with diet and exercise.   Today, he denies symptoms of palpitations, chest pain, orthopnea, PND, lower extremity edema, dizziness, presyncope, syncope, bleeding, or neurologic sequela. The patient is tolerating medications without difficulties and is otherwise without complaint today.    Atrial Fibrillation Risk Factors:  he does have symptoms or diagnosis of sleep apnea. he does not have a history of rheumatic fever. he does not have a history of alcohol use. The patient does not have a history of early familial atrial fibrillation or other arrhythmias.  he has a BMI of  Body mass index is 29.79 kg/m.Marland Kitchen Filed Weights   08/12/21 1348  Weight: 96.9 kg      Family History  Problem Relation Age of Onset   Hypertension Mother    Hyperlipidemia Mother    Alzheimer's disease Mother    Heart disease Father    Healthy Sister    Cancer Brother    Healthy Brother      Atrial Fibrillation Management history:  Previous antiarrhythmic drugs: amiodarone  Previous cardioversions: none Previous ablations: none CHADS2VASC score: 2 Anticoagulation history: Eliquis   Past Medical History:  Diagnosis Date   CKD (chronic kidney disease) stage 3, GFR 30-59 ml/min (HCC)    Gout    HLD (hyperlipidemia)    Past Surgical History:  Procedure Laterality Date   TEE WITHOUT CARDIOVERSION N/A 06/10/2021   Procedure: TRANSESOPHAGEAL ECHOCARDIOGRAM (TEE);  Surgeon: Werner Lean, MD;  Location: [facility] ENDOSCOPY;  Service: Cardiovascular;  Laterality: N/A;    Current Outpatient Medications  Medication Sig Dispense Refill   acetaminophen (TYLENOL) 325 MG tablet Take 650 mg by mouth every 6 (six) hours as needed for mild pain, moderate pain or headache.     allopurinol (ZYLOPRIM) 300 MG tablet Take 300 mg by mouth daily.     amiodarone (PACERONE) 200 MG tablet Take 1 tablet (200 mg total) by mouth daily. 90 tablet 3   apixaban (ELIQUIS) 5 MG TABS tablet Take 1 tablet (5 mg total) by mouth 2 (two) times daily. 180 tablet 3   atorvastatin (LIPITOR) 10 MG tablet Take 1 tablet (10 mg total) by  mouth daily. 90 tablet 3   digoxin (LANOXIN) 0.125 MG tablet Take 1 tablet (0.125 mg total) by mouth daily. 30 tablet 11   metoprolol succinate (TOPROL-XL) 25 MG 24 hr tablet Take 25 mg by mouth as needed (for heart rate greater than 110 BPM).     No current facility-administered medications for this encounter.    Allergies  Allergen Reactions   Colchicine     Sweating   Indomethacin     Other reaction(s): difficulty breathing    Social History   Socioeconomic  History   Marital status: Married    Spouse name: Brian Weeks   Number of children: 2   Years of education: Not on file   Highest education level: Not on file  Occupational History   Not on file  Tobacco Use   Smoking status: Former    Types: Cigarettes   Smokeless tobacco: Never   Tobacco comments:    Former smoker 06/16/2021  Substance and Sexual Activity   Alcohol use: Yes    Alcohol/week: 2.0 - 3.0 standard drinks    Types: 2 - 3 Glasses of wine per week    Comment: 2-3 glasses weekly 06/16/2021   Drug use: Never   Sexual activity: Not on file  Other Topics Concern   Not on file  Social History Narrative   Not on file   Social Determinants of Health   Financial Resource Strain: Not on file  Food Insecurity: Not on file  Transportation Needs: Not on file  Physical Activity: Not on file  Stress: Not on file  Social Connections: Not on file  Intimate Partner Violence: Not on file     ROS- All systems are reviewed and negative except as per the HPI above.  Physical Exam: Vitals:   08/12/21 1348  BP: 130/74  Pulse: 64  Weight: 96.9 kg  Height: 5\' 11"  (1.803 m)    GEN- The patient is a well appearing male, alert and oriented x 3 today.   HEENT-head normocephalic, atraumatic, sclera clear, conjunctiva pink, hearing intact, trachea midline. Lungs- Clear to ausculation bilaterally, normal work of breathing Heart- Regular rate and rhythm, no murmurs, rubs or gallops  GI- soft, NT, ND, + BS Extremities- no clubbing, cyanosis, or edema MS- no significant deformity or atrophy Skin- no rash or lesion Psych- euthymic mood, full affect Neuro- strength and sensation are intact   Wt Readings from Last 3 Encounters:  08/12/21 96.9 kg  07/06/21 97.1 kg  07/01/21 97.5 kg    EKG today demonstrates  SR Vent. rate 64 BPM PR interval 174 ms QRS duration 86 ms QT/QTcB 416/429 ms  Echo 06/09/21 demonstrated   1. Left ventricular ejection fraction, by estimation, is 20  to 25%. The  left ventricle has severely decreased function. The left ventricle  demonstrates global hypokinesis. The left ventricular internal cavity size was mildly dilated. Left ventricular diastolic function could not be evaluated.   2. Right ventricular systolic function is moderately reduced. The right  ventricular size is normal. There is moderately elevated pulmonary artery systolic pressure. The estimated right ventricular systolic pressure is 08.6 mmHg.   3. Left atrial size was mildly dilated.   4. Right atrial size was mildly dilated.   5. The mitral valve is grossly normal. Mild to moderate mitral valve  regurgitation. No evidence of mitral stenosis.   6. Tricuspid valve regurgitation is moderate.   7. The aortic valve is tricuspid. Aortic valve regurgitation is not  visualized. No aortic stenosis  is present.   8. The inferior vena cava is dilated in size with <50% respiratory  variability, suggesting right atrial pressure of 15 mmHg.   Epic records are reviewed at length today  CHA2DS2-VASc Score = 2  The patient's score is based upon: CHF History: 1 HTN History: 0 Diabetes History: 0 Stroke History: 0 Vascular Disease History: 0 Age Score: 1 Gender Score: 0        ASSESSMENT AND PLAN: 1. Persistent Atrial Fibrillation (ICD10:  I48.19) The patient's CHA2DS2-VASc score is 2, indicating a 2.2% annual risk of stroke.   Zio monitor showed 21% afib burden. We discussed afib ablation given his recurrent afib and decreased EF. He is agreeable to consultation with EP. He would like to be able to come off amiodarone long term.  Continue amiodarone 200 mg daily Continue metoprolol 25 mg PRN for elevated heart rate >100 bpm. Continue Eliquis 5 mg BID Continue digoxin 0.125 mg daily  2. Secondary Hypercoagulable State (ICD10:  D68.69) The patient is at significant risk for stroke/thromboembolism based upon his CHA2DS2-VASc Score of 2.  Continue Apixaban (Eliquis).   3.  Snoring/witnessed apnea Scheduled for sleep consult.  4. HFrEF EF 20-25%, suspected related to afib. No signs or symptoms of fluid overload.  5. Valvular heart disease Mild-moderate MR, moderate TR   Follow up with EP to discuss possible ablation.    [facility] [address] [place], [place] [address] [phone] 08/12/2021 2:03 PM

## 2021-08-12 ENCOUNTER — Ambulatory Visit (HOSPITAL_COMMUNITY)
Admission: RE | Admit: 2021-08-12 | Discharge: 2021-08-12 | Disposition: A | Discharge status: Home / Self Care | Payer: Medicare Other | Source: Ambulatory Visit | Attending: Physician Assistant | Admitting: Physician Assistant

## 2021-08-12 ENCOUNTER — Other Ambulatory Visit: Payer: Self-pay

## 2021-08-12 ENCOUNTER — Encounter (HOSPITAL_COMMUNITY): Payer: Self-pay | Admitting: Physician Assistant

## 2021-08-12 VITALS — BP 130/74 | HR 64 | Ht 71.0 in | Wt 213.6 lb

## 2021-08-12 DIAGNOSIS — R0681 Apnea, not elsewhere classified: Secondary | ICD-10-CM | POA: Insufficient documentation

## 2021-08-12 DIAGNOSIS — I081 Rheumatic disorders of both mitral and tricuspid valves: Secondary | ICD-10-CM | POA: Insufficient documentation

## 2021-08-12 DIAGNOSIS — R0683 Snoring: Secondary | ICD-10-CM | POA: Insufficient documentation

## 2021-08-12 DIAGNOSIS — I5020 Unspecified systolic (congestive) heart failure: Secondary | ICD-10-CM | POA: Diagnosis not present

## 2021-08-12 DIAGNOSIS — N183 Chronic kidney disease, stage 3 unspecified: Secondary | ICD-10-CM | POA: Diagnosis not present

## 2021-08-12 DIAGNOSIS — Z7901 Long term (current) use of anticoagulants: Secondary | ICD-10-CM | POA: Insufficient documentation

## 2021-08-12 DIAGNOSIS — Z79899 Other long term (current) drug therapy: Secondary | ICD-10-CM | POA: Diagnosis not present

## 2021-08-12 DIAGNOSIS — I4819 Other persistent atrial fibrillation: Secondary | ICD-10-CM | POA: Diagnosis not present

## 2021-08-12 DIAGNOSIS — E785 Hyperlipidemia, unspecified: Secondary | ICD-10-CM | POA: Diagnosis not present

## 2021-08-12 DIAGNOSIS — D6869 Other thrombophilia: Secondary | ICD-10-CM | POA: Insufficient documentation

## 2021-09-02 DIAGNOSIS — N1831 Chronic kidney disease, stage 3a: Secondary | ICD-10-CM | POA: Diagnosis not present

## 2021-09-02 DIAGNOSIS — M109 Gout, unspecified: Secondary | ICD-10-CM | POA: Diagnosis not present

## 2021-09-02 DIAGNOSIS — R7303 Prediabetes: Secondary | ICD-10-CM | POA: Diagnosis not present

## 2021-09-02 DIAGNOSIS — E782 Mixed hyperlipidemia: Secondary | ICD-10-CM | POA: Diagnosis not present

## 2021-09-02 DIAGNOSIS — I509 Heart failure, unspecified: Secondary | ICD-10-CM | POA: Diagnosis not present

## 2021-09-10 DIAGNOSIS — M19032 Primary osteoarthritis, left wrist: Secondary | ICD-10-CM | POA: Diagnosis not present

## 2021-09-10 DIAGNOSIS — M109 Gout, unspecified: Secondary | ICD-10-CM | POA: Diagnosis not present

## 2021-09-10 DIAGNOSIS — M12842 Other specific arthropathies, not elsewhere classified, left hand: Secondary | ICD-10-CM | POA: Diagnosis not present

## 2021-09-14 ENCOUNTER — Other Ambulatory Visit: Payer: Self-pay

## 2021-09-14 ENCOUNTER — Emergency Department (HOSPITAL_COMMUNITY): Payer: Medicare Other

## 2021-09-14 ENCOUNTER — Emergency Department (HOSPITAL_COMMUNITY)
Admission: EM | Admit: 2021-09-14 | Discharge: 2021-09-15 | Disposition: A | Discharge status: Home / Self Care | Payer: Medicare Other | Attending: Emergency Medicine | Admitting: Emergency Medicine

## 2021-09-14 ENCOUNTER — Encounter (HOSPITAL_COMMUNITY): Payer: Self-pay | Admitting: Emergency Medicine

## 2021-09-14 DIAGNOSIS — N183 Chronic kidney disease, stage 3 unspecified: Secondary | ICD-10-CM | POA: Diagnosis not present

## 2021-09-14 DIAGNOSIS — R0789 Other chest pain: Secondary | ICD-10-CM | POA: Diagnosis not present

## 2021-09-14 DIAGNOSIS — R0781 Pleurodynia: Secondary | ICD-10-CM | POA: Diagnosis not present

## 2021-09-14 DIAGNOSIS — Z79899 Other long term (current) drug therapy: Secondary | ICD-10-CM | POA: Diagnosis not present

## 2021-09-14 DIAGNOSIS — R072 Precordial pain: Secondary | ICD-10-CM

## 2021-09-14 DIAGNOSIS — Z7901 Long term (current) use of anticoagulants: Secondary | ICD-10-CM | POA: Insufficient documentation

## 2021-09-14 DIAGNOSIS — I4891 Unspecified atrial fibrillation: Secondary | ICD-10-CM | POA: Diagnosis not present

## 2021-09-14 DIAGNOSIS — R079 Chest pain, unspecified: Secondary | ICD-10-CM | POA: Diagnosis not present

## 2021-09-14 LAB — CBC
HCT: 39.5 % (ref 39.0–52.0)
Hemoglobin: 12 g/dL — ABNORMAL LOW (ref 13.0–17.0)
MCH: 26.8 pg (ref 26.0–34.0)
MCHC: 30.4 g/dL (ref 30.0–36.0)
MCV: 88.4 fL (ref 80.0–100.0)
Platelets: 282 10*3/uL (ref 150–400)
RBC: 4.47 MIL/uL (ref 4.22–5.81)
RDW: 19 % — ABNORMAL HIGH (ref 11.5–15.5)
WBC: 11.5 10*3/uL — ABNORMAL HIGH (ref 4.0–10.5)
nRBC: 0 % (ref 0.0–0.2)

## 2021-09-14 LAB — BASIC METABOLIC PANEL
Anion gap: 10 (ref 5–15)
BUN: 36 mg/dL — ABNORMAL HIGH (ref 8–23)
CO2: 25 mmol/L (ref 22–32)
Calcium: 9.1 mg/dL (ref 8.9–10.3)
Chloride: 107 mmol/L (ref 98–111)
Creatinine, Ser: 1.94 mg/dL — ABNORMAL HIGH (ref 0.61–1.24)
GFR, Estimated: 37 mL/min — ABNORMAL LOW (ref >60)
Glucose, Bld: 101 mg/dL — ABNORMAL HIGH (ref 70–99)
Potassium: 4.1 mmol/L (ref 3.5–5.1)
Sodium: 142 mmol/L (ref 135–145)

## 2021-09-14 LAB — TROPONIN I (HIGH SENSITIVITY)
Troponin I (High Sensitivity): 12 ng/L (ref <18)
Troponin I (High Sensitivity): 12 ng/L (ref <18)

## 2021-09-14 NOTE — ED Provider Triage Note (Signed)
Emergency Medicine Provider Triage Evaluation Note  Brian Weeks , a 71 y.o. male  was evaluated in triage.  Pt complains of chest pain for several days.  Patient has history of atrial fibrillation and gout.  He notes that he went out to eat over the weekend, and has had left-sided chest pressure since then.  Review of Systems  Positive: Chest pressure Negative: Shortness of breath, leg swelling  Physical Exam  BP 105/88    Pulse 82    Temp 98.3 F (36.8 C) (Oral)    Resp 18    SpO2 97%  Gen:   Awake, no distress   Resp:  Normal effort  MSK:   Moves extremities without difficulty  Other:    Medical Decision Making  Medically screening exam initiated at 4:52 PM.  Appropriate orders placed.  Brian Weeks was informed that the remainder of the evaluation will be completed by another provider, this initial triage assessment does not replace that evaluation, and the importance of remaining in the ED until their evaluation is complete.     Brian Plummer, PA-C 09/14/21 1656

## 2021-09-14 NOTE — ED Triage Notes (Signed)
Pt sent from PCP office for further evaluation of left sided chest-sided chest pressure that started last night. Denies shortness of breath, reports his BP was elevated last night.

## 2021-09-15 ENCOUNTER — Telehealth: Payer: Self-pay | Admitting: *Deleted

## 2021-09-15 DIAGNOSIS — I5021 Acute systolic (congestive) heart failure: Secondary | ICD-10-CM

## 2021-09-15 NOTE — Telephone Encounter (Signed)
Order placed.  Will route phone note to echo scheduler.

## 2021-09-15 NOTE — ED Provider Notes (Signed)
[facility] EMERGENCY DEPARTMENT Provider Note   CSN: [id number] Arrival date & time: 09/14/21  1533     History  Chief Complaint  Patient presents with   Chest Pain    Brian Weeks is a 71 y.o. male.  HPI     This is a 71 year old male with a history of atrial fibrillation on Eliquis, cardiomyopathy with an EF of 20 to 25%, hyperlipidemia who presents with chest discomfort.  Patient developed chest discomfort yesterday.  States that it was left-sided.  He believes it was more exertional but is unsure.  He is not having any chest discomfort right now.  No shortness of breath.  No recent fevers or illnesses.  Patient reports compliance with his Eliquis at home.  He has never had an ischemic evaluation to his knowledge.  He was admitted back in the fall for new onset atrial fibrillation but was found to have a thrombus and was not cardioverted at that time.  Home Medications Prior to Admission medications   Medication Sig Start Date End Date Taking? Authorizing Provider  acetaminophen (TYLENOL) 325 MG tablet Take 650 mg by mouth every 6 (six) hours as needed for mild pain, moderate pain or headache.    [provider]  allopurinol (ZYLOPRIM) 300 MG tablet Take 300 mg by mouth daily. 06/29/21   [provider]  amiodarone (PACERONE) 200 MG tablet Take 1 tablet (200 mg total) by mouth daily. 07/01/21   Imogene Burn, PA-C  apixaban (ELIQUIS) 5 MG TABS tablet Take 1 tablet (5 mg total) by mouth 2 (two) times daily. 07/01/21   Imogene Burn, PA-C  atorvastatin (LIPITOR) 10 MG tablet Take 1 tablet (10 mg total) by mouth daily. 07/01/21   Imogene Burn, PA-C  digoxin (LANOXIN) 0.125 MG tablet Take 1 tablet (0.125 mg total) by mouth daily. 07/13/21   Imogene Burn, PA-C  metoprolol succinate (TOPROL-XL) 25 MG 24 hr tablet Take 25 mg by mouth as needed (for heart rate greater than 110 BPM).    [provider]      Allergies     Colchicine and Indomethacin    Review of Systems   Review of Systems  Respiratory:  Positive for chest tightness. Negative for shortness of breath.   All other systems reviewed and are negative.  Physical Exam Updated Vital Signs BP 120/81    Pulse (!) 52    Temp 98.3 F (36.8 C) (Oral)    Resp 16    SpO2 100%  Physical Exam Vitals and nursing note reviewed.  Constitutional:      Appearance: He is well-developed. He is not ill-appearing.  HENT:     Head: Normocephalic and atraumatic.  Eyes:     Pupils: Pupils are equal, round, and reactive to light.  Cardiovascular:     Rate and Rhythm: Normal rate and regular rhythm.     Heart sounds: Normal heart sounds. No murmur heard. Pulmonary:     Effort: Pulmonary effort is normal. No respiratory distress.     Breath sounds: Normal breath sounds. No wheezing.  Abdominal:     General: Bowel sounds are normal.     Palpations: Abdomen is soft.     Tenderness: There is no abdominal tenderness. There is no rebound.  Musculoskeletal:     Cervical back: Neck supple.     Right lower leg: No tenderness. No edema.     Left lower leg: No tenderness. No edema.  Lymphadenopathy:  Cervical: No cervical adenopathy.  Skin:    General: Skin is warm and dry.  Neurological:     Mental Status: He is alert and oriented to person, place, and time.  Psychiatric:        Mood and Affect: Mood normal.    ED Results / Procedures / Treatments   Labs (all labs ordered are listed, but only abnormal results are displayed) Labs Reviewed  BASIC METABOLIC PANEL - Abnormal; Notable for the following components:      Result Value   Glucose, Bld 101 (*)    BUN 36 (*)    Creatinine, Ser 1.94 (*)    GFR, Estimated 37 (*)    All other components within normal limits  CBC - Abnormal; Notable for the following components:   WBC 11.5 (*)    Hemoglobin 12.0 (*)    RDW 19.0 (*)    All other components within normal limits  TROPONIN I (HIGH SENSITIVITY)   TROPONIN I (HIGH SENSITIVITY)    EKG EKG Interpretation  Date/Time:  Tuesday September 15 2021 05:11:37 EST Ventricular Rate:  61 PR Interval:  157 QRS Duration: 104 QT Interval:  470 QTC Calculation: 474 R Axis:   41 Text Interpretation: Sinus rhythm Confirmed by Thayer Jew [phone]) on 09/15/2021 5:22:36 AM  Radiology DG Chest 2 View  Result Date: 09/14/2021 CLINICAL DATA:  Left chest pain and pressure. EXAM: CHEST - 2 VIEW COMPARISON:  06/18/2021 FINDINGS: Thoracic spondylosis. The lungs appear clear. Cardiac and mediastinal contours normal. No pleural effusion identified. IMPRESSION: 1.  No active cardiopulmonary disease is radiographically apparent. 2. Thoracic spondylosis. Electronically Signed   By: Van Clines M.D.   On: 09/14/2021 17:24    Procedures Procedures    Medications Ordered in ED Medications - No data to display  ED Course/ Medical Decision Making/ A&P Clinical Course as of 09/15/21 0543  Tue Sep 15, 2021  0542 Discussed with cardiology fellow, Conley Canal.  He has reviewed the patient's EKG and work-up.  He agrees that patient can follow-up with Dr. Burt Knack as an outpatient. [CH]    Clinical Course User Index [CH] Prescilla Monger, Barbette Hair, MD                           Medical Decision Making  This patient presents to the ED for concern of chest pain, this involves an extensive number of treatment options, and is a complaint that carries with it a high risk of complications and morbidity.  The differential diagnosis includes ACS, pneumonia, PE, atrial fib  MDM:    This is a 71 year old male who presents with chest pain.  Onset yesterday.  Saw his primary physician who referred him here for further evaluation.  Previous history of atrial fibrillation and cardiomyopathy thought to be secondary to this.  Has not had any ischemic evaluation that I can see.  His EKG is completely normal without any ischemic changes or arrhythmia.  Troponin x2 negative.  He is  currently asymptomatic.  Chest x-ray without pneumothorax or pneumonia.  Overall work-up is reassuring.  Patient is on blood thinners with pulse ox of 100% and pulse rate in the 50s.  Have low suspicion for PE.  Discussed the patient with the cardiology fellow on-call.  See clinical course above.  We will plan for outpatient evaluation.  Patient is agreeable to plan. (Labs, imaging)  Labs: I Ordered, and personally interpreted labs.  The pertinent results include: Normal troponin x2  Imaging Studies ordered: I ordered imaging studies including chest x-ray I independently visualized and interpreted imaging. I agree with the radiologist interpretation  Additional history obtained from wife.  External records from outside source obtained and reviewed including prior echocardiogram, EKG, work-up  Critical Interventions: None  Consultations: I requested consultation with the cardiology,  and discussed lab and imaging findings as well as pertinent plan - they recommend: Patient work-up  Cardiac Monitoring: The patient was maintained on a cardiac monitor.  I personally viewed and interpreted the cardiac monitored which showed an underlying rhythm of: Normal sinus rhythm  Reevaluation: After the interventions noted above, I reevaluated the patient and found that they have :improved   Considered admission for: Chest pain  Social Determinants of Health: Lives independent  Disposition: Discharge with close cardiology follow-up  Co morbidities that complicate the patient evaluation  Past Medical History:  Diagnosis Date   CKD (chronic kidney disease) stage 3, GFR 30-59 ml/min (HCC)    Gout    HLD (hyperlipidemia)      Medicines No orders of the defined types were placed in this encounter.   I have reviewed the patients home medicines and have made adjustments as needed  Problem List / ED Course: Problem List Items Addressed This Visit   None Visit Diagnoses     Precordial pain     -  Primary                   Final Clinical Impression(s) / ED Diagnoses Final diagnoses:  Precordial pain    Rx / DC Orders ED Discharge Orders     None         Merryl Hacker, MD 09/15/21 [phone]

## 2021-09-15 NOTE — Discharge Instructions (Signed)
You were seen today for chest pain.  Your heart work-up is very reassuring.  Follow-up with Dr. Burt Knack for further evaluation.  If you develop new pain, worsening pain, additional symptoms such as shortness of breath or sweating, you should be reevaluated.

## 2021-09-15 NOTE — Telephone Encounter (Signed)
-----   Message from Sherren Mocha, MD sent at 09/15/2021  8:33 AM EST ----- This patient is scheduled to see Dr Quentin Ore in 2 weeks for consult re: AF ablation. Could you schedule him for an echo prior to that visit? He had severe LV dysfunction when hospitalized in October with LVEF 20-25% and important to know whether LV has improved. I just got a message from one of the ED docs that he was seen for chest pain, had normal EKG and troponins.   thx

## 2021-09-18 ENCOUNTER — Telehealth: Payer: Self-pay

## 2021-09-18 NOTE — Telephone Encounter (Signed)
-----   Message from Vickie Epley, MD sent at 09/18/2021 10:57 AM EST ----- Brian Weeks.  Tina/[place], Can you make sure the echo occurs before I see him so I can review the images? Thanks, Brian Weeks ----- Message ----- From: Sherren Mocha, MD Sent: 09/15/2021   8:35 AM EST To: Vickie Epley, MD, [facility] Triage  This patient is scheduled to see Dr Quentin Ore in 2 weeks for consult re: AF ablation. Could you schedule him for an echo prior to that visit? He had severe LV dysfunction when hospitalized in October with LVEF 20-25% and important to know whether LV has improved. I just got a message from one of the ED docs that he was seen for chest pain, had normal EKG and troponins.   thx

## 2021-09-21 ENCOUNTER — Other Ambulatory Visit (HOSPITAL_COMMUNITY): Payer: Medicare Other

## 2021-09-23 ENCOUNTER — Ambulatory Visit (HOSPITAL_COMMUNITY)
Admission: RE | Admit: 2021-09-23 | Discharge: 2021-09-23 | Disposition: A | Discharge status: Home / Self Care | Payer: Medicare Other | Source: Ambulatory Visit | Attending: Cardiovascular Disease | Admitting: Cardiovascular Disease

## 2021-09-23 ENCOUNTER — Other Ambulatory Visit: Payer: Self-pay

## 2021-09-23 DIAGNOSIS — I5021 Acute systolic (congestive) heart failure: Secondary | ICD-10-CM | POA: Diagnosis not present

## 2021-09-23 DIAGNOSIS — E785 Hyperlipidemia, unspecified: Secondary | ICD-10-CM | POA: Insufficient documentation

## 2021-09-23 LAB — ECHOCARDIOGRAM COMPLETE
Area-P 1/2: 2.6 cm2
S' Lateral: 4.1 cm

## 2021-09-28 NOTE — Progress Notes (Deleted)
Electrophysiology Office Note:    Date:  09/28/2021   ID:  Brian Weeks, Brian Weeks [birth year], MRN [id number]  PCP:  Kristen Loader, FNP  [facility] Cardiologist:  Sherren Mocha, MD  [facility] Electrophysiologist:  None   Referring MD: Oliver Barre, [place]   Chief Complaint: Atrial fibrillation  History of Present Illness:    Brian Weeks is a 71 y.o. male who presents for an evaluation of atrial fibrillation at the request of Adline Peals, PA-C. Their medical history includes chronic systolic heart failure, sleep disordered breathing, moderate MR.  The patient last saw Adline Peals on August 12, 2021.  He is maintained on amiodarone for his atrial fibrillation and at that appointment was maintaining normal rhythm.  He also takes metoprolol and Eliquis for stroke prophylaxis.  He is interested in pursuing possible catheter ablation for his atrial fibrillation in an effort to avoid long-term exposure to amiodarone.     Past Medical History:  Diagnosis Date   CKD (chronic kidney disease) stage 3, GFR 30-59 ml/min (HCC)    Gout    HLD (hyperlipidemia)     Past Surgical History:  Procedure Laterality Date   TEE WITHOUT CARDIOVERSION N/A 06/10/2021   Procedure: TRANSESOPHAGEAL ECHOCARDIOGRAM (TEE);  Surgeon: Werner Lean, MD;  Location: [facility] ENDOSCOPY;  Service: Cardiovascular;  Laterality: N/A;    Current Medications: No outpatient medications have been marked as taking for the 09/29/21 encounter (Appointment) with Vickie Epley, MD.     Allergies:   Colchicine and Indomethacin   Social History   Socioeconomic History   Marital status: Married    Spouse name: [name]   Number of children: 2   Years of education: Not on file   Highest education level: Not on file  Occupational History   Not on file  Tobacco Use   Smoking status: Former    Types: Cigarettes   Smokeless tobacco: Never   Tobacco comments:    Former smoker 06/16/2021   Substance and Sexual Activity   Alcohol use: Yes    Alcohol/week: 2.0 - 3.0 standard drinks    Types: 2 - 3 Glasses of wine per week    Comment: 2-3 glasses weekly 06/16/2021   Drug use: Never   Sexual activity: Not on file  Other Topics Concern   Not on file  Social History Narrative   Not on file   Social Determinants of Health   Financial Resource Strain: Not on file  Food Insecurity: Not on file  Transportation Needs: Not on file  Physical Activity: Not on file  Stress: Not on file  Social Connections: Not on file     Family History: The patient's family history includes Alzheimer's disease in his mother; Cancer in his brother; Healthy in his brother and sister; Heart disease in his father; Hyperlipidemia in his mother; Hypertension in his mother.  ROS:   Please see the history of present illness.    All other systems reviewed and are negative.  EKGs/Labs/Other Studies Reviewed:    The following studies were reviewed today:  September 23, 2021 echo Left ventricular function low normal, 50 to 55% Right ventricular function normal Trivial MR  July 29, 2021 ZIO monitor 21% atrial fibrillation burden  EKG:  The ekg ordered today demonstrates ***   Recent Labs: 06/08/2021: TSH 3.281 06/18/2021: B Natriuretic Peptide 1,958.5; Magnesium 2.8 06/20/2021: ALT 226 09/14/2021: BUN 36; Creatinine, Ser 1.94; Hemoglobin 12.0; Platelets 282; Potassium 4.1; Sodium 142  Recent Lipid Panel  Component Value Date/Time   CHOL 111 06/09/2021 0152   TRIG 40 06/09/2021 0152   HDL 32 (L) 06/09/2021 0152   CHOLHDL 3.5 06/09/2021 0152   VLDL 8 06/09/2021 0152   LDLCALC 71 06/09/2021 0152    Physical Exam:    VS:  There were no vitals taken for this visit.    Wt Readings from Last 3 Encounters:  08/12/21 213 lb 9.6 oz (96.9 kg)  07/06/21 214 lb (97.1 kg)  07/01/21 215 lb (97.5 kg)     GEN: *** Well nourished, well developed in no acute distress HEENT: Normal NECK: No  JVD; No carotid bruits LYMPHATICS: No lymphadenopathy CARDIAC: ***RRR, no murmurs, rubs, gallops RESPIRATORY:  Clear to auscultation without rales, wheezing or rhonchi  ABDOMEN: Soft, non-tender, non-distended MUSCULOSKELETAL:  No edema; No deformity  SKIN: Warm and dry NEUROLOGIC:  Alert and oriented x 3 PSYCHIATRIC:  Normal affect       ASSESSMENT:    No diagnosis found. PLAN:    In order of problems listed above:    AF ablation     Total time spent with patient today *** minutes. This includes reviewing records, evaluating the patient and coordinating care.  Medication Adjustments/Labs and Tests Ordered: Current medicines are reviewed at length with the patient today.  Concerns regarding medicines are outlined above.  No orders of the defined types were placed in this encounter.  No orders of the defined types were placed in this encounter.    Signed, Hilton Cork. Quentin Ore, MD, [facility], [facility] 09/28/2021 12:40 PM    Electrophysiology [facility]

## 2021-09-29 ENCOUNTER — Encounter: Payer: Self-pay | Admitting: Cardiology

## 2021-09-29 ENCOUNTER — Ambulatory Visit: Payer: Medicare Other | Admitting: Cardiology

## 2021-09-29 ENCOUNTER — Encounter: Payer: Self-pay | Admitting: *Deleted

## 2021-09-29 ENCOUNTER — Telehealth: Payer: Self-pay

## 2021-09-29 ENCOUNTER — Other Ambulatory Visit: Payer: Self-pay

## 2021-09-29 ENCOUNTER — Other Ambulatory Visit (HOSPITAL_COMMUNITY): Payer: Medicare Other

## 2021-09-29 VITALS — BP 122/76 | HR 68 | Ht 71.0 in | Wt 211.6 lb

## 2021-09-29 DIAGNOSIS — I4819 Other persistent atrial fibrillation: Secondary | ICD-10-CM | POA: Diagnosis not present

## 2021-09-29 DIAGNOSIS — I4891 Unspecified atrial fibrillation: Secondary | ICD-10-CM

## 2021-09-29 DIAGNOSIS — R29818 Other symptoms and signs involving the nervous system: Secondary | ICD-10-CM

## 2021-09-29 DIAGNOSIS — I428 Other cardiomyopathies: Secondary | ICD-10-CM

## 2021-09-29 NOTE — Progress Notes (Signed)
Electrophysiology Office Note:    Date:  09/29/2021   ID:  Brian Weeks, DOB 11/06/1950, MRN [id number]  PCP:  Kristen Loader, FNP  [facility] Cardiologist:  Sherren Mocha, MD  [facility] Electrophysiologist:  None   Referring MD: Oliver Barre, [place]   Chief Complaint: Atrial fibrillation  History of Present Illness:    Brian Weeks is a 71 y.o. male who presents for an evaluation of atrial fibrillation at the request of Adline Peals, PA-C. Their medical history includes chronic systolic heart failure, sleep disordered breathing, moderate MR.  The patient last saw Adline Peals on August 12, 2021.  He is maintained on amiodarone for his atrial fibrillation and at that appointment was maintaining normal rhythm.  He also takes metoprolol and Eliquis for stroke prophylaxis.  He is interested in pursuing possible catheter ablation for his atrial fibrillation in an effort to avoid long-term exposure to amiodarone.  He is accompanied by a family member today. Overall, he is feeling good.  Previously, while in atrial fibrillation he would feel shortness of breath and fatigue/malaise. He never actually felt any palpitations. At the time he had believed he was becoming deconditioned.  Since returning to normal rhythm his symptoms have greatly improved. Occasionally (about 1 out of 10 days) he will feel a little fatigued and lightheaded.   He denies any chest pain, or peripheral edema. No headaches, syncope, orthopnea, or PND.     Past Medical History:  Diagnosis Date   CKD (chronic kidney disease) stage 3, GFR 30-59 ml/min (HCC)    Gout    HLD (hyperlipidemia)     Past Surgical History:  Procedure Laterality Date   TEE WITHOUT CARDIOVERSION N/A 06/10/2021   Procedure: TRANSESOPHAGEAL ECHOCARDIOGRAM (TEE);  Surgeon: Werner Lean, MD;  Location: [facility] ENDOSCOPY;  Service: Cardiovascular;  Laterality: N/A;    Current Medications: Current Meds  Medication  Sig   acetaminophen (TYLENOL) 325 MG tablet Take 650 mg by mouth every 6 (six) hours as needed for mild pain, moderate pain or headache.   allopurinol (ZYLOPRIM) 300 MG tablet Take 300 mg by mouth daily.   amiodarone (PACERONE) 200 MG tablet Take 1 tablet (200 mg total) by mouth daily.   apixaban (ELIQUIS) 5 MG TABS tablet Take 1 tablet (5 mg total) by mouth 2 (two) times daily.   atorvastatin (LIPITOR) 10 MG tablet Take 1 tablet (10 mg total) by mouth daily.   digoxin (LANOXIN) 0.125 MG tablet Take 1 tablet (0.125 mg total) by mouth daily.   metoprolol succinate (TOPROL-XL) 25 MG 24 hr tablet Take 25 mg by mouth as needed (for heart rate greater than 110 BPM).     Allergies:   Colchicine and Indomethacin   Social History   Socioeconomic History   Marital status: Married    Spouse name: [name]   Number of children: 2   Years of education: Not on file   Highest education level: Not on file  Occupational History   Not on file  Tobacco Use   Smoking status: Former    Types: Cigarettes   Smokeless tobacco: Never   Tobacco comments:    Former smoker 06/16/2021  Substance and Sexual Activity   Alcohol use: Yes    Alcohol/week: 2.0 - 3.0 standard drinks    Types: 2 - 3 Glasses of wine per week    Comment: 2-3 glasses weekly 06/16/2021   Drug use: Never   Sexual activity: Not on file  Other Topics Concern  Not on file  Social History Narrative   Not on file   Social Determinants of Health   Financial Resource Strain: Not on file  Food Insecurity: Not on file  Transportation Needs: Not on file  Physical Activity: Not on file  Stress: Not on file  Social Connections: Not on file     Family History: The patient's family history includes Alzheimer's disease in his mother; Cancer in his brother; Healthy in his brother and sister; Heart disease in his father; Hyperlipidemia in his mother; Hypertension in his mother.  ROS:   Please see the history of present illness.    (+)  Fatigue (+) Lightheadedness All other systems reviewed and are negative.  EKGs/Labs/Other Studies Reviewed:    The following studies were reviewed today:  September 23, 2021 echo Left ventricular function low normal, 50 to 55% Right ventricular function normal Trivial MR  July 29, 2021 ZIO monitor 21% atrial fibrillation burden  EKG:   EKG is personally reviewed. 09/29/2021: Sinus rhythm   Recent Labs: 06/08/2021: TSH 3.281 06/18/2021: B Natriuretic Peptide 1,958.5; Magnesium 2.8 06/20/2021: ALT 226 09/14/2021: BUN 36; Creatinine, Ser 1.94; Hemoglobin 12.0; Platelets 282; Potassium 4.1; Sodium 142   Recent Lipid Panel    Component Value Date/Time   CHOL 111 06/09/2021 0152   TRIG 40 06/09/2021 0152   HDL 32 (L) 06/09/2021 0152   CHOLHDL 3.5 06/09/2021 0152   VLDL 8 06/09/2021 0152   LDLCALC 71 06/09/2021 0152    Physical Exam:    VS:  BP 122/76    Pulse 68    Ht 5\' 11"  (1.803 m)    Wt 211 lb 9.6 oz (96 kg)    SpO2 97%    BMI 29.51 kg/m     Wt Readings from Last 3 Encounters:  09/29/21 211 lb 9.6 oz (96 kg)  08/12/21 213 lb 9.6 oz (96.9 kg)  07/06/21 214 lb (97.1 kg)     GEN: Well nourished, well developed in no acute distress HEENT: Normal NECK: No JVD; No carotid bruits LYMPHATICS: No lymphadenopathy CARDIAC: RRR, no murmurs, rubs, gallops RESPIRATORY:  Clear to auscultation without rales, wheezing or rhonchi  ABDOMEN: Soft, non-tender, non-distended MUSCULOSKELETAL:  No edema; No deformity  SKIN: Warm and dry NEUROLOGIC:  Alert and oriented x 3 PSYCHIATRIC:  Normal affect       ASSESSMENT:    1. Persistent atrial fibrillation ([place])   2. NICM (nonischemic cardiomyopathy) ([place])   3. Suspected sleep apnea   4. Atrial fibrillation, unspecified type ([place])    PLAN:    In order of problems listed above:  #Persistent atrial fibrillation Symptomatic.  Rhythm control is indicated.  I discussed continuing amiodarone versus alternative antiarrhythmic  drugs.  Also discussed catheter ablation or today's visit.  I discussed the pros and cons of each strategy.  I discussed the catheter ablation procedure in detail including the risks, recovery and likelihood of success.  After discussion, the patient elected to proceed with catheter ablation.  We will plan to continue amiodarone for at least the first 90 days after a catheter ablation.  He will continue Eliquis uninterrupted.  Risk, benefits, and alternatives to EP study and radiofrequency ablation for afib were also discussed in detail today. These risks include but are not limited to stroke, bleeding, vascular damage, tamponade, perforation, damage to the esophagus, lungs, and other structures, pulmonary vein stenosis, worsening renal function, and death. The patient understands these risk and wishes to proceed.  We will therefore proceed with  catheter ablation at the next available time.  Carto, ICE, anesthesia are requested for the procedure.  Will also obtain CT PV protocol prior to the procedure to exclude LAA thrombus and further evaluate atrial anatomy.  #Nonischemic cardiomyopathy NYHA class II.  EF has improved now that he is in normal rhythm.  Rhythm control is indicated as above.  Continue Toprol, amiodarone.  #Suspected sleep apnea Patient has been referred for sleep study.    Total time spent with patient today 65 minutes. This includes reviewing records, evaluating the patient and coordinating care.  Medication Adjustments/Labs and Tests Ordered: Current medicines are reviewed at length with the patient today.  Concerns regarding medicines are outlined above.   Orders Placed This Encounter  Procedures   CT CARDIAC MORPH/PULM VEIN W/CM&W/O CA SCORE   CBC w/Diff   Basic Metabolic Panel (BMET)   EKG 12-Lead   No orders of the defined types were placed in this encounter.  I,Mathew Stumpf,acting as a Education administrator for Vickie Epley, MD.,have documented all relevant documentation on  the behalf of Vickie Epley, MD,as directed by  Vickie Epley, MD while in the presence of Vickie Epley, MD.  I, Vickie Epley, MD, have reviewed all documentation for this visit. The documentation on 09/29/21 for the exam, diagnosis, procedures, and orders are all accurate and complete.   Signed, Hilton Cork. Quentin Ore, MD, [facility], [facility] 09/29/2021 1:42 PM    Electrophysiology [facility]

## 2021-09-29 NOTE — Telephone Encounter (Signed)
-----   Message from Sherren Mocha, MD sent at 09/29/2021 10:42 AM EST ----- Excellent findings.  Significant improvement in LV function.  This is nearly diagnostic that the severe LV dysfunction previously seen is related to atrial fibrillation and was tachycardia mediated.  Would continue current medical therapy. Pt to see Dr Quentin Ore for consideration of AFib ablation

## 2021-09-29 NOTE — Patient Instructions (Signed)
Medication Instructions:  Your physician recommends that you continue on your current medications as directed. Please refer to the Current Medication list given to you today. *If you need a refill on your cardiac medications before your next appointment, please call your pharmacy*  Lab Work: None. If you have labs (blood work) drawn today and your tests are completely normal, you will receive your results only by: [place] (if you have MyChart) OR A paper copy in the mail If you have any lab test that is abnormal or we need to change your treatment, we will call you to review the results.  Testing/Procedures: Your physician has requested that you have cardiac CT. Cardiac computed tomography (CT) is a painless test that uses an x-ray machine to take clear, detailed pictures of your heart. For further information please visit [email or web address].[email or web address]. Please follow instruction sheet as given.  Your physician has recommended that you have an ablation. Catheter ablation is a medical procedure used to treat some cardiac arrhythmias (irregular heartbeats). During catheter ablation, a long, thin, flexible tube is put into a blood vessel in your groin (upper thigh), or neck. This tube is called an ablation catheter. It is then guided to your heart through the blood vessel. Radio frequency waves destroy small areas of heart tissue where abnormal heartbeats may cause an arrhythmia to start. Please see the instruction sheet given to you today.   Follow-Up: At [facility], you and your health needs are our priority.  As part of our continuing mission to provide you with exceptional heart care, we have created designated Provider Care Teams.  These Care Teams include your primary Cardiologist (physician) and Advanced Practice Providers (APPs -  Physician Assistants and Nurse Practitioners) who all work together to provide you with the care you need, when you need it.  Your physician wants you to  follow-up in: see instruction letter.   We recommend signing up for the patient portal called "MyChart".  Sign up information is provided on this After Visit Summary.  MyChart is used to connect with patients for Virtual Visits (Telemedicine).  Patients are able to view lab/test results, encounter notes, upcoming appointments, etc.  Non-urgent messages can be sent to your provider as well.   To learn more about what you can do with MyChart, go to [email or web address].    Any Other Special Instructions Will Be Listed Below (If Applicable).  Cardiac Ablation Cardiac ablation is a procedure to destroy (ablate) some heart tissue that is sending bad signals. These bad signals cause problems in heart rhythm. The heart has many areas that make these signals. If there are problems in these areas, they can make the heart beat in a way that is not normal. Destroying some tissues can help make the heart rhythm normal. Tell your doctor about: Any allergies you have. All medicines you are taking. These include vitamins, herbs, eye drops, creams, and over-the-counter medicines. Any problems you or family members have had with medicines that make you fall asleep (anesthetics). Any blood disorders you have. Any surgeries you have had. Any medical conditions you have, such as kidney failure. Whether you are pregnant or may be pregnant. What are the risks? This is a safe procedure. But problems may occur, including: Infection. Bruising and bleeding. Bleeding into the chest. Stroke or blood clots. Damage to nearby areas of your body. Allergies to medicines or dyes. The need for a pacemaker if the normal system is damaged. Failure of the procedure to treat the problem. What  happens before the procedure? Medicines Ask your doctor about: Changing or stopping your normal medicines. This is important. Taking aspirin and ibuprofen. Do not take these medicines unless your doctor tells you to take  them. Taking other medicines, vitamins, herbs, and supplements. General instructions Follow instructions from your doctor about what you cannot eat or drink. Plan to have someone take you home from the hospital or clinic. If you will be going home right after the procedure, plan to have someone with you for 24 hours. Ask your doctor what steps will be taken to prevent infection. What happens during the procedure?  An IV tube will be put into one of your veins. You will be given a medicine to help you relax. The skin on your neck or groin will be numbed. A cut (incision) will be made in your neck or groin. A needle will be put through your cut and into a large vein. A tube (catheter) will be put into the needle. The tube will be moved to your heart. Dye may be put through the tube. This helps your doctor see your heart. Small devices (electrodes) on the tube will send out signals. A type of energy will be used to destroy some heart tissue. The tube will be taken out. Pressure will be held on your cut. This helps stop bleeding. A bandage will be put over your cut. The exact procedure may vary among doctors and hospitals. What happens after the procedure? You will be watched until you leave the hospital or clinic. This includes checking your heart rate, breathing rate, oxygen, and blood pressure. Your cut will be watched for bleeding. You will need to lie still for a few hours. Do not drive for 24 hours or as long as your doctor tells you. Summary Cardiac ablation is a procedure to destroy some heart tissue. This is done to treat heart rhythm problems. Tell your doctor about any medical conditions you may have. Tell him or her about all medicines you are taking to treat them. This is a safe procedure. But problems may occur. These include infection, bruising, bleeding, and damage to nearby areas of your body. Follow what your doctor tells you about food and drink. You may also be told to  change or stop some of your medicines. After the procedure, do not drive for 24 hours or as long as your doctor tells you. This information is not intended to replace advice given to you by your health care provider. Make sure you discuss any questions you have with your health care provider. Document Revised: 07/05/2019 Document Reviewed: 07/05/2019 Elsevier Patient Education  2022 [employer].

## 2021-09-30 NOTE — Telephone Encounter (Signed)
Cooper ordered ECHO post hospital stay due to LV dysfunction and pt needing ablation. Pt seen in office yesterday for ablation consult, but neither hospital or our clinic has checked liver enzymes since November when they were elevated. Pt remains on amiodarone. Will route message to Dr Mardene Speak nurse to try and get order for CMP or hepatic function to get updated results for patient.

## 2021-10-13 ENCOUNTER — Telehealth: Payer: Self-pay | Admitting: Cardiovascular Disease

## 2021-10-13 ENCOUNTER — Encounter: Payer: Self-pay | Admitting: Cardiology

## 2021-10-13 NOTE — Telephone Encounter (Signed)
error 

## 2021-10-13 NOTE — Telephone Encounter (Signed)
° °  Patient Name:  Brian Weeks  DOB:  Feb 07, 1951  MRN:  [id number]   Primary Cardiologist: Sherren Mocha, MD  Chart reviewed as part of pre-operative protocol coverage.   Dental cleanings, X-rays and simple dental extractions are considered low risk procedures per guidelines and generally do not require any specific cardiac clearance. It is also generally accepted that for simple extractions and dental cleanings, there is no need to interrupt blood thinner therapy.  SBE prophylaxis is not required for the patient from a cardiac standpoint.  Please call with questions.  Richardson Dopp, PA-C 10/13/2021, 2:20 PM

## 2021-10-13 NOTE — Telephone Encounter (Signed)
Notes faxed to surgeon. This phone note will be removed from the preop pool. Richardson Dopp, PA-C  10/13/2021 2:23 PM

## 2021-10-13 NOTE — Telephone Encounter (Signed)
° °  Pre-operative Risk Assessment    Patient Name: Brian Weeks  DOB: 1951-04-07 MRN: [id number]     Request for Surgical Clearance    Procedure:   Teeth Cleaning and X-Rays  Date of Surgery:  Clearance TBD                                 Surgeon:  Dr. Vale Haven Group or Practice Name:  [facility] Phone number:  [phone] Fax number:  [phone]   Type of Clearance Requested:   - Medical    Type of Anesthesia:  None    Additional requests/questions:  Please fax a copy of medical clearance to the surgeon's office.  Romilda Garret   10/13/2021, 11:49 AM

## 2021-10-14 NOTE — Telephone Encounter (Signed)
Our office received another clearance request. I called the office and stated that we faxed clearance yesterday . DDS office states never received. I was asked if I could please re-fax and send by email as well. I did state that we don't generally email clearance notes, though I will on this pt.  ? ?[email or web address] ?

## 2021-11-04 DIAGNOSIS — G4733 Obstructive sleep apnea (adult) (pediatric): Secondary | ICD-10-CM | POA: Diagnosis not present

## 2021-11-24 ENCOUNTER — Other Ambulatory Visit: Payer: Medicare Other

## 2021-11-27 ENCOUNTER — Other Ambulatory Visit: Payer: Medicare Other | Admitting: *Deleted

## 2021-11-27 DIAGNOSIS — I4891 Unspecified atrial fibrillation: Secondary | ICD-10-CM | POA: Diagnosis not present

## 2021-11-27 DIAGNOSIS — R29818 Other symptoms and signs involving the nervous system: Secondary | ICD-10-CM

## 2021-11-27 DIAGNOSIS — I4819 Other persistent atrial fibrillation: Secondary | ICD-10-CM

## 2021-11-27 DIAGNOSIS — I428 Other cardiomyopathies: Secondary | ICD-10-CM

## 2021-11-28 LAB — BASIC METABOLIC PANEL
BUN/Creatinine Ratio: 12 (ref 10–24)
BUN: 23 mg/dL (ref 8–27)
CO2: 23 mmol/L (ref 20–29)
Calcium: 9.1 mg/dL (ref 8.6–10.2)
Chloride: 105 mmol/L (ref 96–106)
Creatinine, Ser: 1.89 mg/dL — ABNORMAL HIGH (ref 0.76–1.27)
Glucose: 127 mg/dL — ABNORMAL HIGH (ref 70–99)
Potassium: 4.7 mmol/L (ref 3.5–5.2)
Sodium: 144 mmol/L (ref 134–144)
eGFR: 37 mL/min/{1.73_m2} — ABNORMAL LOW (ref >59)

## 2021-11-28 LAB — CBC WITH DIFFERENTIAL/PLATELET
Basophils Absolute: 0.1 10*3/uL (ref 0.0–0.2)
Basos: 1 %
EOS (ABSOLUTE): 0.4 10*3/uL (ref 0.0–0.4)
Eos: 5 %
Hematocrit: 36.1 % — ABNORMAL LOW (ref 37.5–51.0)
Hemoglobin: 12.3 g/dL — ABNORMAL LOW (ref 13.0–17.7)
Immature Grans (Abs): 0 10*3/uL (ref 0.0–0.1)
Immature Granulocytes: 0 %
Lymphocytes Absolute: 1.6 10*3/uL (ref 0.7–3.1)
Lymphs: 17 %
MCH: 30.3 pg (ref 26.6–33.0)
MCHC: 34.1 g/dL (ref 31.5–35.7)
MCV: 89 fL (ref 79–97)
Monocytes Absolute: 0.9 10*3/uL (ref 0.1–0.9)
Monocytes: 10 %
Neutrophils Absolute: 6.1 10*3/uL (ref 1.4–7.0)
Neutrophils: 67 %
Platelets: 159 10*3/uL (ref 150–450)
RBC: 4.06 x10E6/uL — ABNORMAL LOW (ref 4.14–5.80)
RDW: 12.9 % (ref 11.6–15.4)
WBC: 9.1 10*3/uL (ref 3.4–10.8)

## 2021-12-07 ENCOUNTER — Telehealth (HOSPITAL_COMMUNITY): Payer: Self-pay | Admitting: Emergency Medicine

## 2021-12-07 NOTE — Telephone Encounter (Signed)
Reaching out to patient to offer assistance regarding upcoming cardiac imaging study; pt verbalizes understanding of appt date/time, parking situation and where to check in, pre-test NPO status and medications ordered, and verified current allergies; name and call back number provided for further questions should they arise ?Makynzie Dobesh RN Navigator Cardiac Imaging ?[facility] ?[phone] office ?[phone] cell ? ?Denies iv issues ?Daily meds ?Arrival 100 ? ?

## 2021-12-08 ENCOUNTER — Ambulatory Visit (HOSPITAL_COMMUNITY)
Admission: RE | Admit: 2021-12-08 | Discharge: 2021-12-08 | Disposition: A | Discharge status: Home / Self Care | Payer: Medicare Other | Source: Ambulatory Visit | Attending: Cardiology | Admitting: Cardiology

## 2021-12-08 DIAGNOSIS — I4891 Unspecified atrial fibrillation: Secondary | ICD-10-CM | POA: Diagnosis not present

## 2021-12-08 MED ORDER — IOHEXOL 350 MG/ML SOLN
100.0000 mL | Freq: Once | INTRAVENOUS | Status: AC | PRN
  Administered 2021-12-08: 100 mL via INTRAVENOUS

## 2021-12-11 NOTE — Pre-Procedure Instructions (Signed)
Instructed patient on the following items: Arrival time 0800 Nothing to eat or drink after midnight No meds AM of procedure Responsible person to drive you home and stay with you for 24 hrs  Have you missed any doses of anti-coagulant Eliquis- hasn't missed any doses   

## 2021-12-14 ENCOUNTER — Ambulatory Visit (HOSPITAL_BASED_OUTPATIENT_CLINIC_OR_DEPARTMENT_OTHER): Payer: Medicare Other | Admitting: Anesthesiology

## 2021-12-14 ENCOUNTER — Ambulatory Visit (HOSPITAL_COMMUNITY)
Admission: RE | Admit: 2021-12-14 | Discharge: 2021-12-14 | Disposition: A | Discharge status: Home / Self Care | Payer: Medicare Other | Source: Ambulatory Visit | Attending: Cardiology | Admitting: Cardiology

## 2021-12-14 ENCOUNTER — Ambulatory Visit (HOSPITAL_COMMUNITY): Payer: Medicare Other | Admitting: Anesthesiology

## 2021-12-14 ENCOUNTER — Other Ambulatory Visit: Payer: Self-pay

## 2021-12-14 ENCOUNTER — Encounter (HOSPITAL_COMMUNITY): Payer: Self-pay | Admitting: Cardiology

## 2021-12-14 ENCOUNTER — Encounter (HOSPITAL_COMMUNITY)
Admission: RE | Disposition: A | Discharge status: Home / Self Care | Payer: Self-pay | Source: Ambulatory Visit | Attending: Cardiology

## 2021-12-14 DIAGNOSIS — I509 Heart failure, unspecified: Secondary | ICD-10-CM | POA: Diagnosis not present

## 2021-12-14 DIAGNOSIS — Z7901 Long term (current) use of anticoagulants: Secondary | ICD-10-CM | POA: Insufficient documentation

## 2021-12-14 DIAGNOSIS — I483 Typical atrial flutter: Secondary | ICD-10-CM | POA: Diagnosis not present

## 2021-12-14 DIAGNOSIS — N289 Disorder of kidney and ureter, unspecified: Secondary | ICD-10-CM | POA: Diagnosis not present

## 2021-12-14 DIAGNOSIS — I4819 Other persistent atrial fibrillation: Secondary | ICD-10-CM | POA: Insufficient documentation

## 2021-12-14 DIAGNOSIS — I5022 Chronic systolic (congestive) heart failure: Secondary | ICD-10-CM | POA: Insufficient documentation

## 2021-12-14 DIAGNOSIS — Z79899 Other long term (current) drug therapy: Secondary | ICD-10-CM | POA: Insufficient documentation

## 2021-12-14 DIAGNOSIS — I4891 Unspecified atrial fibrillation: Secondary | ICD-10-CM | POA: Diagnosis not present

## 2021-12-14 DIAGNOSIS — I428 Other cardiomyopathies: Secondary | ICD-10-CM | POA: Insufficient documentation

## 2021-12-14 HISTORY — PX: ATRIAL FIBRILLATION ABLATION: EP1191

## 2021-12-14 LAB — POCT ACTIVATED CLOTTING TIME
Activated Clotting Time: 329 seconds
Activated Clotting Time: 335 seconds

## 2021-12-14 SURGERY — ATRIAL FIBRILLATION ABLATION
Anesthesia: General

## 2021-12-14 MED ORDER — ONDANSETRON HCL 4 MG/2ML IJ SOLN: 4.0000 mg | Freq: Four times a day (QID) | INTRAMUSCULAR | Status: DC | PRN

## 2021-12-14 MED ORDER — HEPARIN (PORCINE) IN NACL 1000-0.9 UT/500ML-% IV SOLN
INTRAVENOUS | Status: AC
  Filled 2021-12-14: qty 500

## 2021-12-14 MED ORDER — SODIUM CHLORIDE 0.9 % IV SOLN: INTRAVENOUS | Status: DC

## 2021-12-14 MED ORDER — FENTANYL CITRATE (PF) 100 MCG/2ML IJ SOLN
INTRAMUSCULAR | Status: DC | PRN
  Administered 2021-12-14 (×2): 50 ug via INTRAVENOUS

## 2021-12-14 MED ORDER — SODIUM CHLORIDE 0.9 % IV SOLN: 250.0000 mL | INTRAVENOUS | Status: DC | PRN

## 2021-12-14 MED ORDER — SODIUM CHLORIDE 0.9% FLUSH: 3.0000 mL | INTRAVENOUS | Status: DC | PRN

## 2021-12-14 MED ORDER — FENTANYL CITRATE (PF) 100 MCG/2ML IJ SOLN
INTRAMUSCULAR | Status: AC
  Filled 2021-12-14: qty 2

## 2021-12-14 MED ORDER — SODIUM CHLORIDE 0.9% FLUSH: 3.0000 mL | Freq: Two times a day (BID) | INTRAVENOUS | Status: DC

## 2021-12-14 MED ORDER — HEPARIN (PORCINE) IN NACL 1000-0.9 UT/500ML-% IV SOLN
INTRAVENOUS | Status: DC | PRN
  Administered 2021-12-14 (×4): 500 mL

## 2021-12-14 MED ORDER — PROTAMINE SULFATE 10 MG/ML IV SOLN
INTRAVENOUS | Status: DC | PRN
  Administered 2021-12-14: 30 mg via INTRAVENOUS

## 2021-12-14 MED ORDER — HEPARIN SODIUM (PORCINE) 1000 UNIT/ML IJ SOLN
INTRAMUSCULAR | Status: AC
  Filled 2021-12-14: qty 10

## 2021-12-14 MED ORDER — DEXAMETHASONE SODIUM PHOSPHATE 10 MG/ML IJ SOLN
INTRAMUSCULAR | Status: DC | PRN
  Administered 2021-12-14: 5 mg via INTRAVENOUS

## 2021-12-14 MED ORDER — APIXABAN 5 MG PO TABS
5.0000 mg | ORAL_TABLET | Freq: Two times a day (BID) | ORAL | Status: DC
  Administered 2021-12-14: 5 mg via ORAL
  Filled 2021-12-14: qty 1

## 2021-12-14 MED ORDER — ONDANSETRON HCL 4 MG/2ML IJ SOLN
INTRAMUSCULAR | Status: DC | PRN
  Administered 2021-12-14: 4 mg via INTRAVENOUS

## 2021-12-14 MED ORDER — HEPARIN SODIUM (PORCINE) 1000 UNIT/ML IJ SOLN
INTRAMUSCULAR | Status: DC | PRN
  Administered 2021-12-14: 1000 [IU] via INTRAVENOUS

## 2021-12-14 MED ORDER — ROCURONIUM BROMIDE 10 MG/ML (PF) SYRINGE
PREFILLED_SYRINGE | INTRAVENOUS | Status: DC | PRN
  Administered 2021-12-14: 70 mg via INTRAVENOUS

## 2021-12-14 MED ORDER — EPHEDRINE SULFATE-NACL 50-0.9 MG/10ML-% IV SOSY
PREFILLED_SYRINGE | INTRAVENOUS | Status: DC | PRN
  Administered 2021-12-14: 5 mg via INTRAVENOUS

## 2021-12-14 MED ORDER — LIDOCAINE 2% (20 MG/ML) 5 ML SYRINGE
INTRAMUSCULAR | Status: DC | PRN
Start: 2021-12-14 — End: 2021-12-14
  Administered 2021-12-14: 100 mg via INTRAVENOUS

## 2021-12-14 MED ORDER — HEPARIN SODIUM (PORCINE) 1000 UNIT/ML IJ SOLN
INTRAMUSCULAR | Status: DC | PRN
Start: 2021-12-14 — End: 2021-12-14
  Administered 2021-12-14: 16000 [IU] via INTRAVENOUS
  Administered 2021-12-14 (×2): 3000 [IU] via INTRAVENOUS

## 2021-12-14 MED ORDER — PHENYLEPHRINE HCL-NACL 20-0.9 MG/250ML-% IV SOLN
INTRAVENOUS | Status: DC | PRN
  Administered 2021-12-14: 30 ug/min via INTRAVENOUS

## 2021-12-14 MED ORDER — PROPOFOL 10 MG/ML IV BOLUS
INTRAVENOUS | Status: DC | PRN
  Administered 2021-12-14: 140 mg via INTRAVENOUS

## 2021-12-14 MED ORDER — ACETAMINOPHEN 325 MG PO TABS
650.0000 mg | ORAL_TABLET | ORAL | Status: DC | PRN
  Filled 2021-12-14: qty 2

## 2021-12-14 MED ORDER — SUGAMMADEX SODIUM 200 MG/2ML IV SOLN
INTRAVENOUS | Status: DC | PRN
  Administered 2021-12-14: 200 mg via INTRAVENOUS

## 2021-12-14 SURGICAL SUPPLY — 17 items
CATH OCTARAY 2.0 F 3-3-3-3-3 (CATHETERS) ×1 IMPLANT
CATH S CIRCA THERM PROBE 10F (CATHETERS) ×1 IMPLANT
CATH SMTCH THERMOCOOL SF DF (CATHETERS) ×1 IMPLANT
CATH SOUNDSTAR ECO 8FR (CATHETERS) ×1 IMPLANT
CATH WEBSTER BI DIR CS D-F CRV (CATHETERS) ×1 IMPLANT
CLOSURE PERCLOSE PROSTYLE (VASCULAR PRODUCTS) ×3 IMPLANT
COVER SWIFTLINK CONNECTOR (BAG) ×2 IMPLANT
PACK EP LATEX FREE (CUSTOM PROCEDURE TRAY) ×2
PACK EP LF (CUSTOM PROCEDURE TRAY) ×1 IMPLANT
PAD DEFIB RADIO PHYSIO CONN (PAD) ×2 IMPLANT
PATCH CARTO3 (PAD) ×1 IMPLANT
SHEATH BAYLIS TRANSSEPTAL 98CM (NEEDLE) ×1 IMPLANT
SHEATH CARTO VIZIGO SM CVD (SHEATH) ×1 IMPLANT
SHEATH PINNACLE 8F 10CM (SHEATH) ×2 IMPLANT
SHEATH PINNACLE 9F 10CM (SHEATH) ×1 IMPLANT
SHEATH PROBE COVER 6X72 (BAG) ×1 IMPLANT
TUBING SMART ABLATE COOLFLOW (TUBING) ×1 IMPLANT

## 2021-12-14 NOTE — Anesthesia Preprocedure Evaluation (Signed)
Anesthesia Evaluation  ?Patient identified by MRN, date of birth, ID band ?Patient awake ? ? ? ?Reviewed: ?Allergy & Precautions, NPO status , Patient's Chart, lab work & pertinent test results ? ?Airway ?Mallampati: II ? ?TM Distance: >3 FB ?Neck ROM: Full ? ? ? Dental ?no notable dental hx. ? ?  ?Pulmonary ?neg pulmonary ROS, former smoker,  ?  ?Pulmonary exam normal ?breath sounds clear to auscultation ? ? ? ? ? ? Cardiovascular ?+CHF  ?+ dysrhythmias Atrial Fibrillation  ?Rhythm:Irregular Rate:Normal ? ?. Left ventricular ejection fraction, by estimation, is 50 to 55%. Left  ?ventricular ejection fraction by 3D volume is 52 %. The left ventricle has  ?low normal function. The left ventricle has no regional wall motion  ?abnormalities. Left ventricular  ?diastolic parameters are consistent with Grade I diastolic dysfunction  ?(impaired relaxation).  ??2. Right ventricular systolic function is normal. The right ventricular  ?size is normal. Tricuspid regurgitation signal is inadequate for assessing  ?PA pressure.  ??3. The mitral valve is grossly normal. Trivial mitral valve  ?regurgitation.  ??4. The aortic valve is tricuspid. Aortic valve regurgitation is not  ?visualized.  ??5. The inferior vena cava is normal in size with greater than 50%  ?respiratory variability, suggesting right atrial pressure of 3 mmHg.  ? ?Comparison(s): Changes from prior study are noted. 06/09/2021: LVEF  ?20-25%, global hypokinesis.  ?  ?Neuro/Psych ?negative neurological ROS ? negative psych ROS  ? GI/Hepatic ?negative GI ROS, Neg liver ROS,   ?Endo/Other  ?negative endocrine ROS ? Renal/GU ?Renal InsufficiencyRenal disease  ?negative genitourinary ?  ?Musculoskeletal ?negative musculoskeletal ROS ?(+)  ? Abdominal ?  ?Peds ?negative pediatric ROS ?(+)  Hematology ?negative hematology ROS ?(+)   ?Anesthesia Other Findings ? ? Reproductive/Obstetrics ?negative OB ROS ? ?  ? ? ? ? ? ? ? ? ? ? ? ? ? ?   ?  ? ? ? ? ? ? ? ? ?Anesthesia Physical ?Anesthesia Plan ? ?ASA: 3 ? ?Anesthesia Plan: General  ? ?Post-op Pain Management: Minimal or no pain anticipated  ? ?Induction: Intravenous ? ?PONV Risk Score and Plan: 2 and Ondansetron, Dexamethasone and Treatment may vary due to age or medical condition ? ?Airway Management Planned: Oral ETT ? ?Additional Equipment:  ? ?Intra-op Plan:  ? ?Post-operative Plan: Extubation in OR ? ?Informed Consent: I have reviewed the patients History and Physical, chart, labs and discussed the procedure including the risks, benefits and alternatives for the proposed anesthesia with the patient or authorized representative who has indicated his/her understanding and acceptance.  ? ? ? ?Dental advisory given ? ?Plan Discussed with: CRNA and Surgeon ? ?Anesthesia Plan Comments:   ? ? ? ? ? ? ?Anesthesia Quick Evaluation ? ?

## 2021-12-14 NOTE — Transfer of Care (Signed)
Immediate Anesthesia Transfer of Care Note ? ?Patient: Brian Weeks ? ?Procedure(s) Performed: ATRIAL FIBRILLATION ABLATION ? ?Patient Location: Cath Lab ? ?Anesthesia Type:General ? ?Level of Consciousness: awake ? ?Airway & Oxygen Therapy: Patient Spontanous Breathing ? ?Post-op Assessment: Report given to RN and Post -op Vital signs reviewed and stable ? ?Post vital signs: Reviewed and stable ? ?Last Vitals:  ?Vitals Value Taken Time  ?BP 114/54 12/14/21 1323  ?Temp    ?Pulse 58 12/14/21 1326  ?Resp 12 12/14/21 1326  ?SpO2 97 % 12/14/21 1326  ?Vitals shown include unvalidated device data. ? ?Last Pain:  ?Vitals:  ? 12/14/21 0909  ?TempSrc:   ?PainSc: 0-No pain  ?   ? ?  ? ?Complications: No notable events documented. ?

## 2021-12-14 NOTE — Anesthesia Procedure Notes (Signed)
Procedure Name: Intubation ?Date/Time: 12/14/2021 10:50 AM ?Performed by: Erick Colace, CRNA ?Pre-anesthesia Checklist: Patient identified, Emergency Drugs available, Suction available and Patient being monitored ?Patient Re-evaluated:Patient Re-evaluated prior to induction ?Oxygen Delivery Method: Circle system utilized ?Preoxygenation: Pre-oxygenation with 100% oxygen ?Induction Type: IV induction and Cricoid Pressure applied ?Ventilation: Mask ventilation without difficulty and Oral airway inserted - appropriate to patient size ?Laryngoscope Size: Mac and 4 ?Grade View: Grade I ?Tube type: Oral ?Number of attempts: 1 ?Airway Equipment and Method: Stylet and Oral airway ?Placement Confirmation: ETT inserted through vocal cords under direct vision, positive ETCO2 and breath sounds checked- equal and bilateral ?Secured at: 23 cm ?Tube secured with: Tape ?Dental Injury: Teeth and Oropharynx as per pre-operative assessment  ? ? ? ? ?

## 2021-12-14 NOTE — Anesthesia Postprocedure Evaluation (Signed)
Anesthesia Post Note ? ?Patient: Eben Choinski Desa ? ?Procedure(s) Performed: ATRIAL FIBRILLATION ABLATION ? ?  ? ?Patient location during evaluation: PACU ?Anesthesia Type: General ?Level of consciousness: awake and alert ?Pain management: pain level controlled ?Vital Signs Assessment: post-procedure vital signs reviewed and stable ?Respiratory status: spontaneous breathing, nonlabored ventilation, respiratory function stable and patient connected to nasal cannula oxygen ?Cardiovascular status: blood pressure returned to baseline and stable ?Postop Assessment: no apparent nausea or vomiting ?Anesthetic complications: no ? ? ?No notable events documented. ? ?Last Vitals:  ?Vitals:  ? 12/14/21 1350 12/14/21 1355  ?BP: 122/65 121/66  ?Pulse: (!) 56 (!) 55  ?Resp: 12 10  ?Temp:  (!) 36 ?C  ?SpO2: 98% 94%  ?  ?Last Pain:  ?Vitals:  ? 12/14/21 0909  ?TempSrc:   ?PainSc: 0-No pain  ? ? ?  ?  ?  ?  ?  ?  ? ?Zamya Culhane S ? ? ? ? ?

## 2021-12-14 NOTE — H&P (Signed)
?Electrophysiology Office Note:   ?  ?Date:  12/14/2021  ?  ?ID:  EBRIMA RANTA, DOB 1951/05/23, MRN [id number] ?  ?PCP:  Kristen Loader, FNP        ?[facility] Cardiologist:  Sherren Mocha, MD  ?[facility] Electrophysiologist:  None  ?  ?Referring MD: Oliver Barre, PA  ?  ?Chief Complaint: Atrial fibrillation ?  ?History of Present Illness:   ?  ?DEAUNDRA KUTZER is a 71 y.o. male who presents for an evaluation of atrial fibrillation at the request of Adline Peals, PA-C. Their medical history includes chronic systolic heart failure, sleep disordered breathing, moderate MR. ? ?The patient last saw Adline Peals on August 12, 2021.  He is maintained on amiodarone for his atrial fibrillation and at that appointment was maintaining normal rhythm.  He also takes metoprolol and Eliquis for stroke prophylaxis.  He is interested in pursuing possible catheter ablation for his atrial fibrillation in an effort to avoid long-term exposure to amiodarone. ?  ?He is accompanied by a family member today. Overall, he is feeling good. ?  ?Previously, while in atrial fibrillation he would feel shortness of breath and fatigue/malaise. He never actually felt any palpitations. At the time he had believed he was becoming deconditioned. ?  ?Since returning to normal rhythm his symptoms have greatly improved. Occasionally (about 1 out of 10 days) he will feel a little fatigued and lightheaded.  ?  ?He denies any chest pain, or peripheral edema. No headaches, syncope, orthopnea, or PND. ?  ?Today he presents for PVI. Doing well. ?  ?  ?Objective  ?  ?    ?Past Medical History:  ?Diagnosis Date  ? CKD (chronic kidney disease) stage 3, GFR 30-59 ml/min (HCC)    ? Gout    ? HLD (hyperlipidemia)    ?  ?  ?     ?Past Surgical History:  ?Procedure Laterality Date  ? TEE WITHOUT CARDIOVERSION N/A 06/10/2021  ?  Procedure: TRANSESOPHAGEAL ECHOCARDIOGRAM (TEE);  Surgeon: Werner Lean, MD;  Location[facility] ENDOSCOPY;   Service: Cardiovascular;  Laterality: N/A;  ?  ?  ?Current Medications: ?Active Medications  ?    ?Current Meds  ?Medication Sig  ? acetaminophen (TYLENOL) 325 MG tablet Take 650 mg by mouth every 6 (six) hours as needed for mild pain, moderate pain or headache.  ? allopurinol (ZYLOPRIM) 300 MG tablet Take 300 mg by mouth daily.  ? amiodarone (PACERONE) 200 MG tablet Take 1 tablet (200 mg total) by mouth daily.  ? apixaban (ELIQUIS) 5 MG TABS tablet Take 1 tablet (5 mg total) by mouth 2 (two) times daily.  ? atorvastatin (LIPITOR) 10 MG tablet Take 1 tablet (10 mg total) by mouth daily.  ? digoxin (LANOXIN) 0.125 MG tablet Take 1 tablet (0.125 mg total) by mouth daily.  ? metoprolol succinate (TOPROL-XL) 25 MG 24 hr tablet Take 25 mg by mouth as needed (for heart rate greater than 110 BPM).  ?  ?  ?  ?Allergies:   Colchicine and Indomethacin  ?  ?Social History  ?  ?     ?Socioeconomic History  ? Marital status: Married  ?    Spouse name: [name]  ? Number of children: 2  ? Years of education: Not on file  ? Highest education level: Not on file  ?Occupational History  ? Not on file  ?Tobacco Use  ? Smoking status: Former  ?    Types: Cigarettes  ?  Smokeless tobacco: Never  ? Tobacco comments:  ?    Former smoker 06/16/2021  ?Substance and Sexual Activity  ? Alcohol use: Yes  ?    Alcohol/week: 2.0 - 3.0 standard drinks  ?    Types: 2 - 3 Glasses of wine per week  ?    Comment: 2-3 glasses weekly 06/16/2021  ? Drug use: Never  ? Sexual activity: Not on file  ?Other Topics Concern  ? Not on file  ?Social History Narrative  ? Not on file  ?  ?Social Determinants of Health  ?  ?Financial Resource Strain: Not on file  ?Food Insecurity: Not on file  ?Transportation Needs: Not on file  ?Physical Activity: Not on file  ?Stress: Not on file  ?Social Connections: Not on file  ?  ?  ?Family History: ?The patient's family history includes Alzheimer's disease in his mother; Cancer in his brother; Healthy in his brother and  sister; Heart disease in his father; Hyperlipidemia in his mother; Hypertension in his mother. ?  ?ROS:   ?Please see the history of present illness.    ?(+) Fatigue ?(+) Lightheadedness ?All other systems reviewed and are negative. ?  ?EKGs/Labs/Other Studies Reviewed:   ?  ?The following studies were reviewed today: ?  ?10/10/21 echo ?Left ventricular function low normal, 50 to 55% ?Right ventricular function normal ?Trivial MR ?  ?July 29, 2021 ZIO monitor ?21% atrial fibrillation burden ?  ?EKG:   EKG is personally reviewed. ?09/29/2021: Sinus rhythm ?  ?  ?Recent Labs: ?06/08/2021: TSH 3.281 ?06/18/2021: B Natriuretic Peptide 1,958.5; Magnesium 2.8 ?06/20/2021: ALT 226 ?09/14/2021: BUN 36; Creatinine, Ser 1.94; Hemoglobin 12.0; Platelets 282; Potassium 4.1; Sodium 142  ?  ?Recent Lipid Panel ?Labs (Brief)  ?     ?   ?Component Value Date/Time  ?  CHOL 111 06/09/2021 0152  ?  TRIG 40 06/09/2021 0152  ?  HDL 32 (L) 06/09/2021 0152  ?  CHOLHDL 3.5 06/09/2021 0152  ?  VLDL 8 06/09/2021 0152  ?  [place] 71 06/09/2021 0152  ?  ?  ?  ?Physical Exam:   ?  ?VS:  BP 138/77   Pulse 63   Ht 5\' 11"  (1.803 m)   Wt 211 lb 9.6 oz (96 kg)   SpO2 97%   BMI 29.51 kg/m?    ?  ?   ?Wt Readings from Last 3 Encounters:  ?09/29/21 211 lb 9.6 oz (96 kg)  ?08/12/21 213 lb 9.6 oz (96.9 kg)  ?07/06/21 214 lb (97.1 kg)  ?  ?  ?GEN: Well nourished, well developed in no acute distress ?HEENT: Normal ?NECK: No JVD; No carotid bruits ?LYMPHATICS: No lymphadenopathy ?CARDIAC: RRR, no murmurs, rubs, gallops ?RESPIRATORY:  Clear to auscultation without rales, wheezing or rhonchi  ?ABDOMEN: Soft, non-tender, non-distended ?MUSCULOSKELETAL:  No edema; No deformity  ?SKIN: Warm and dry ?NEUROLOGIC:  Alert and oriented x 3 ?PSYCHIATRIC:  Normal affect  ?  ?  ?  ?  ?Assessment ?  ?  ?ASSESSMENT:   ?  ?1. Persistent atrial fibrillation ([place])   ?2. NICM (nonischemic cardiomyopathy) ([place])   ?3. Suspected sleep apnea   ?4. Atrial  fibrillation, unspecified type ([place])   ?  ?PLAN:   ?  ?In order of problems listed above: ?  ?#Persistent atrial fibrillation ?Symptomatic.  Rhythm control is indicated.  I discussed continuing amiodarone versus alternative antiarrhythmic drugs.  Also discussed catheter ablation or today's visit.  I discussed the pros  and cons of each strategy.  I discussed the catheter ablation procedure in detail including the risks, recovery and likelihood of success.  After discussion, the patient elected to proceed with catheter ablation.  We will plan to continue amiodarone for at least the first 90 days after a catheter ablation.  He will continue Eliquis uninterrupted. ?  ?Risk, benefits, and alternatives to EP study and radiofrequency ablation for afib were also discussed in detail today. These risks include but are not limited to stroke, bleeding, vascular damage, tamponade, perforation, damage to the esophagus, lungs, and other structures, pulmonary vein stenosis, worsening renal function, and death. The patient understands these risk and wishes to proceed.  We will therefore proceed with catheter ablation at the next available time.  Carto, ICE, anesthesia are requested for the procedure.   ? ?Presents for PVI today. Procedure reviewed. ?  ?#Nonischemic cardiomyopathy ?NYHA class II.  EF has improved now that he is in normal rhythm.  Rhythm control is indicated as above.  Continue Toprol, amiodarone. ? ?  ?  ?Signed, ?Lysbeth Galas T. Quentin Ore, MD, [facility], [place] ?12/14/2021 ?Electrophysiology ?[place] ?  ?

## 2021-12-15 ENCOUNTER — Encounter (HOSPITAL_COMMUNITY): Payer: Self-pay | Admitting: Cardiology

## 2021-12-15 NOTE — Progress Notes (Signed)
Patient stated that he had gained 7lbs since yesterday. He also stated that he have a history of CHF. He stated that he do not have Lasix. I instructed him to call his PCP as soon as we got off the phone. He stated that he will and he understood the importance of it. ?

## 2021-12-18 ENCOUNTER — Telehealth (HOSPITAL_COMMUNITY): Payer: Self-pay

## 2021-12-18 NOTE — Telephone Encounter (Signed)
Patient called in regarding questions post ablation. He is concerned because he has a small bump on the right side of his groin and he has had some constipation. Patient states he strained when he had a bowel movement. He has been drinking prune juice and he was able to have a bowel movement. He wanted to know if this is normal post ablation. Consulted with [employer] and patient was told it is normal to have a small bump for several weeks post ablation. Patient advised to contact the clinic back if he has any bleeding or pain at the site. Consulted with patient and he verbalized understanding. ?

## 2021-12-28 DIAGNOSIS — I4819 Other persistent atrial fibrillation: Secondary | ICD-10-CM | POA: Diagnosis not present

## 2021-12-28 DIAGNOSIS — Z0000 Encounter for general adult medical examination without abnormal findings: Secondary | ICD-10-CM | POA: Diagnosis not present

## 2021-12-28 DIAGNOSIS — D6869 Other thrombophilia: Secondary | ICD-10-CM | POA: Diagnosis not present

## 2021-12-28 DIAGNOSIS — D649 Anemia, unspecified: Secondary | ICD-10-CM | POA: Diagnosis not present

## 2021-12-28 DIAGNOSIS — G4700 Insomnia, unspecified: Secondary | ICD-10-CM | POA: Diagnosis not present

## 2021-12-28 DIAGNOSIS — N1831 Chronic kidney disease, stage 3a: Secondary | ICD-10-CM | POA: Diagnosis not present

## 2021-12-28 DIAGNOSIS — R238 Other skin changes: Secondary | ICD-10-CM | POA: Diagnosis not present

## 2021-12-28 DIAGNOSIS — M109 Gout, unspecified: Secondary | ICD-10-CM | POA: Diagnosis not present

## 2021-12-28 DIAGNOSIS — E782 Mixed hyperlipidemia: Secondary | ICD-10-CM | POA: Diagnosis not present

## 2022-01-08 DIAGNOSIS — N1831 Chronic kidney disease, stage 3a: Secondary | ICD-10-CM | POA: Diagnosis not present

## 2022-01-12 ENCOUNTER — Ambulatory Visit (HOSPITAL_COMMUNITY)
Admission: RE | Admit: 2022-01-12 | Discharge: 2022-01-12 | Disposition: A | Discharge status: Home / Self Care | Payer: Medicare Other | Source: Ambulatory Visit | Attending: Physician Assistant | Admitting: Physician Assistant

## 2022-01-12 ENCOUNTER — Encounter (HOSPITAL_COMMUNITY): Payer: Self-pay | Admitting: Physician Assistant

## 2022-01-12 VITALS — BP 132/84 | HR 68 | Ht 71.0 in | Wt 207.2 lb

## 2022-01-12 DIAGNOSIS — R9431 Abnormal electrocardiogram [ECG] [EKG]: Secondary | ICD-10-CM | POA: Diagnosis not present

## 2022-01-12 DIAGNOSIS — E785 Hyperlipidemia, unspecified: Secondary | ICD-10-CM | POA: Diagnosis not present

## 2022-01-12 DIAGNOSIS — Z79899 Other long term (current) drug therapy: Secondary | ICD-10-CM | POA: Insufficient documentation

## 2022-01-12 DIAGNOSIS — I5022 Chronic systolic (congestive) heart failure: Secondary | ICD-10-CM | POA: Insufficient documentation

## 2022-01-12 DIAGNOSIS — Z7901 Long term (current) use of anticoagulants: Secondary | ICD-10-CM | POA: Insufficient documentation

## 2022-01-12 DIAGNOSIS — N184 Chronic kidney disease, stage 4 (severe): Secondary | ICD-10-CM | POA: Diagnosis not present

## 2022-01-12 DIAGNOSIS — R0683 Snoring: Secondary | ICD-10-CM | POA: Insufficient documentation

## 2022-01-12 DIAGNOSIS — N183 Chronic kidney disease, stage 3 unspecified: Secondary | ICD-10-CM | POA: Insufficient documentation

## 2022-01-12 DIAGNOSIS — G4739 Other sleep apnea: Secondary | ICD-10-CM | POA: Insufficient documentation

## 2022-01-12 DIAGNOSIS — I4819 Other persistent atrial fibrillation: Secondary | ICD-10-CM | POA: Insufficient documentation

## 2022-01-12 DIAGNOSIS — I4892 Unspecified atrial flutter: Secondary | ICD-10-CM | POA: Diagnosis not present

## 2022-01-12 DIAGNOSIS — D6869 Other thrombophilia: Secondary | ICD-10-CM | POA: Diagnosis not present

## 2022-01-12 DIAGNOSIS — Z8249 Family history of ischemic heart disease and other diseases of the circulatory system: Secondary | ICD-10-CM | POA: Insufficient documentation

## 2022-01-12 DIAGNOSIS — I4510 Unspecified right bundle-branch block: Secondary | ICD-10-CM | POA: Diagnosis not present

## 2022-01-12 DIAGNOSIS — Z8349 Family history of other endocrine, nutritional and metabolic diseases: Secondary | ICD-10-CM | POA: Insufficient documentation

## 2022-01-12 DIAGNOSIS — I081 Rheumatic disorders of both mitral and tricuspid valves: Secondary | ICD-10-CM | POA: Insufficient documentation

## 2022-01-12 NOTE — Progress Notes (Signed)
Primary Care Physician: Kristen Loader, FNP Primary Cardiologist: Dr Burt Knack Primary Electrophysiologist: Dr Quentin Ore Referring Physician: Dr Lidia Collum is a 71 y.o. male with a history of CKD, HLD, HFrEF, atrial fibrillation who presents for follow up in the [facility]. Patient presented to his PCP with worsening SOB and palpitations for one month. He was found to be in afib with RVR and was sent to the ED. ECG showed afib with rapid rates. Echo revealed EF 20-25%. He was started on Eliquis for a CHADS2VASC score of 2. Plan was for TEE guided DCCV but TEE showed heavy smoke and concern for thrombus. He was loaded on amiodarone and started on metoprolol and digoxin for rate control. He had converted to SR by his hospital follow up visit. He denies alcohol use but does admit to snoring and witnessed apnea (wife confirms). Patient readmitted to the hospital 06/18/2021 with AKI with Crt 4.58.  He became bradycardic and hypotensive and held his beta-blocker in the hospital.  Also had elevated liver function tests possibly secondary to hypotensive episode with mild ischemic liver injury. Crt 2.22 at discharge. Zio monitor showed 21% afib with avg HR of 109.   On follow up today, patient is s/p afib and atrial flutter ablation with Dr Quentin Ore on 12/14/21. He reports that he has done well from a cardiac standpoint. He denies any known episodes of afib. He did have a small knot at his right groin cath site but his has resolved. No CP or swallowing issues. No bleeding issues on anticoagulation.   Today, he denies symptoms of palpitations, chest pain, orthopnea, PND, lower extremity edema, dizziness, presyncope, syncope, bleeding, or neurologic sequela. The patient is tolerating medications without difficulties and is otherwise without complaint today.    Atrial Fibrillation Risk Factors:  he does have symptoms or diagnosis of sleep apnea. he does not have a  history of rheumatic fever. he does not have a history of alcohol use. The patient does not have a history of early familial atrial fibrillation or other arrhythmias.  he has a BMI of Body mass index is 28.9 kg/m.Marland Kitchen Filed Weights   01/12/22 0859  Weight: 94 kg     Family History  Problem Relation Age of Onset   Hypertension Mother    Hyperlipidemia Mother    Alzheimer's disease Mother    Heart disease Father    Healthy Sister    Cancer Brother    Healthy Brother      Atrial Fibrillation Management history:  Previous antiarrhythmic drugs: amiodarone  Previous cardioversions: none Previous ablations: 12/14/21 CHADS2VASC score: 2 Anticoagulation history: Eliquis   Past Medical History:  Diagnosis Date   CKD (chronic kidney disease) stage 3, GFR 30-59 ml/min (HCC)    Gout    HLD (hyperlipidemia)    Past Surgical History:  Procedure Laterality Date   ATRIAL FIBRILLATION ABLATION N/A 12/14/2021   Procedure: ATRIAL FIBRILLATION ABLATION;  Surgeon: Vickie Epley, MD;  Location: [facility];  Service: Cardiovascular;  Laterality: N/A;   TEE WITHOUT CARDIOVERSION N/A 06/10/2021   Procedure: TRANSESOPHAGEAL ECHOCARDIOGRAM (TEE);  Surgeon: Werner Lean, MD;  Location: [facility] ENDOSCOPY;  Service: Cardiovascular;  Laterality: N/A;    Current Outpatient Medications  Medication Sig Dispense Refill   acetaminophen (TYLENOL) 325 MG tablet Take 650 mg by mouth every 6 (six) hours as needed for mild pain, moderate pain or headache.     allopurinol (ZYLOPRIM) 300 MG tablet  Take 300 mg by mouth daily.     amiodarone (PACERONE) 200 MG tablet Take 1 tablet (200 mg total) by mouth daily. 90 tablet 3   apixaban (ELIQUIS) 5 MG TABS tablet Take 1 tablet (5 mg total) by mouth 2 (two) times daily. 180 tablet 3   atorvastatin (LIPITOR) 10 MG tablet Take 1 tablet (10 mg total) by mouth daily. 90 tablet 3   digoxin (LANOXIN) 0.125 MG tablet Take 1 tablet (0.125 mg total) by mouth  daily. 30 tablet 11   metoprolol succinate (TOPROL-XL) 25 MG 24 hr tablet Take 25 mg by mouth as needed (for heart rate greater than 110 BPM).     No current facility-administered medications for this encounter.    Allergies  Allergen Reactions   Indomethacin Anaphylaxis    Other reaction(s): difficulty breathing Other reaction(s): difficulty breathing Other reaction(s): difficulty breathing     Social History   Socioeconomic History   Marital status: Married    Spouse name: [name]   Number of children: 2   Years of education: Not on file   Highest education level: Not on file  Occupational History   Not on file  Tobacco Use   Smoking status: Former    Types: Cigarettes   Smokeless tobacco: Never   Tobacco comments:    Former smoker 06/16/2021  Substance and Sexual Activity   Alcohol use: Yes    Alcohol/week: 2.0 - 3.0 standard drinks    Types: 2 - 3 Glasses of wine per week    Comment: 2-3 glasses weekly 06/16/2021   Drug use: Never   Sexual activity: Not on file  Other Topics Concern   Not on file  Social History Narrative   Not on file   Social Determinants of Health   Financial Resource Strain: Not on file  Food Insecurity: Not on file  Transportation Needs: Not on file  Physical Activity: Not on file  Stress: Not on file  Social Connections: Not on file  Intimate Partner Violence: Not on file     ROS- All systems are reviewed and negative except as per the HPI above.  Physical Exam: Vitals:   01/12/22 0859  BP: 132/84  Pulse: 68  Weight: 94 kg  Height: 5\' 11"  (1.803 m)     GEN- The patient is a well appearing male, alert and oriented x 3 today.   HEENT-head normocephalic, atraumatic, sclera clear, conjunctiva pink, hearing intact, trachea midline. Lungs- Clear to ausculation bilaterally, normal work of breathing Heart- Regular rate and rhythm, no murmurs, rubs or gallops  GI- soft, NT, ND, + BS Extremities- no clubbing, cyanosis, or  edema MS- no significant deformity or atrophy Skin- no rash or lesion Psych- euthymic mood, full affect Neuro- strength and sensation are intact   Wt Readings from Last 3 Encounters:  01/12/22 94 kg  12/14/21 94.3 kg  09/29/21 96 kg    EKG today demonstrates  SR Vent. rate 68 BPM PR interval 182 ms QRS duration 94 ms QT/QTcB 418/444 ms  Echo 09/23/21 demonstrated   1. Left ventricular ejection fraction, by estimation, is 50 to 55%. Left  ventricular ejection fraction by 3D volume is 52 %. The left ventricle has low normal function. The left ventricle has no regional wall motion  abnormalities. Left ventricular diastolic parameters are consistent with Grade I diastolic dysfunction (impaired relaxation).   2. Right ventricular systolic function is normal. The right ventricular  size is normal. Tricuspid regurgitation signal is inadequate for assessing PA  pressure.   3. The mitral valve is grossly normal. Trivial mitral valve  regurgitation.   4. The aortic valve is tricuspid. Aortic valve regurgitation is not  visualized.   5. The inferior vena cava is normal in size with greater than 50%  respiratory variability, suggesting right atrial pressure of 3 mmHg.   Comparison(s): Changes from prior study are noted. 06/09/2021: LVEF  20-25%, global hypokinesis.   Epic records are reviewed at length today  CHA2DS2-VASc Score = 2  The patient's score is based upon: CHF History: 1 HTN History: 0 Diabetes History: 0 Stroke History: 0 Vascular Disease History: 0 Age Score: 1 Gender Score: 0      ASSESSMENT AND PLAN: 1. Persistent Atrial Fibrillation/atrial flutter The patient's CHA2DS2-VASc score is 2, indicating a 2.2% annual risk of stroke.   S/p afib and flutter ablation 12/14/21 Patient appears to be maintaining SR. Continue amiodarone 200 mg daily for now, hopefully this will be short term post ablation.  Continue metoprolol 25 mg PRN for elevated heart rate >100  bpm. Continue Eliquis 5 mg BID with no missed doses for 3 months post ablation.  Continue digoxin 0.125 mg daily  2. Secondary Hypercoagulable State (ICD10:  D68.69) The patient is at significant risk for stroke/thromboembolism based upon his CHA2DS2-VASc Score of 2.  Continue Apixaban (Eliquis).   3. 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 HFrEF EF 20-25%, suspected related to afib. EF normalized with SR. Appears euvolemic today.   Follow up with Dr Quentin Ore as scheduled.    [facility] [address] [place], [place] [address] [phone] 01/12/2022 9:08 AM

## 2022-01-29 DIAGNOSIS — N179 Acute kidney failure, unspecified: Secondary | ICD-10-CM | POA: Diagnosis not present

## 2022-01-29 DIAGNOSIS — I5020 Unspecified systolic (congestive) heart failure: Secondary | ICD-10-CM | POA: Diagnosis not present

## 2022-01-29 DIAGNOSIS — I480 Paroxysmal atrial fibrillation: Secondary | ICD-10-CM | POA: Diagnosis not present

## 2022-01-29 DIAGNOSIS — N1832 Chronic kidney disease, stage 3b: Secondary | ICD-10-CM | POA: Diagnosis not present

## 2022-01-29 DIAGNOSIS — N184 Chronic kidney disease, stage 4 (severe): Secondary | ICD-10-CM | POA: Diagnosis not present

## 2022-02-01 ENCOUNTER — Other Ambulatory Visit: Payer: Self-pay | Admitting: Physician Assistant

## 2022-02-01 DIAGNOSIS — I4891 Unspecified atrial fibrillation: Secondary | ICD-10-CM

## 2022-02-01 NOTE — Telephone Encounter (Signed)
Prescription refill request for Eliquis received. Indication: a fib Last office visit: 01/12/22 Scr: 1.89 Age: 71 Weight: 94kg

## 2022-02-09 ENCOUNTER — Other Ambulatory Visit: Payer: Self-pay | Admitting: Physician Assistant

## 2022-02-17 DIAGNOSIS — N1832 Chronic kidney disease, stage 3b: Secondary | ICD-10-CM | POA: Diagnosis not present

## 2022-03-17 ENCOUNTER — Ambulatory Visit: Payer: Medicare Other | Admitting: Cardiology

## 2022-03-19 ENCOUNTER — Other Ambulatory Visit: Payer: Self-pay | Admitting: Physician Assistant

## 2022-03-19 ENCOUNTER — Telehealth: Payer: Self-pay | Admitting: *Deleted

## 2022-03-19 NOTE — Telephone Encounter (Signed)
Pharmacy team please advise on hold time for patient's Eliquis for upcoming colonoscopy procedure.  Thank you

## 2022-03-19 NOTE — Telephone Encounter (Signed)
   Pre-operative Risk Assessment    Patient Name: BRAXDON GAPPA  DOB: 1951/05/26 MRN: [id number]      Request for Surgical Clearance    Procedure:   COLONOSCOPY  Date of Surgery:  Clearance TBD                                 Surgeon:   Surgeon's Group or Practice Name:  [facility] Phone number:  [phone] Fax number:  [phone]   Type of Clearance Requested:   - Pharmacy:  Hold Apixaban (Eliquis) 1-2 DAYS   Type of Anesthesia:   PROPOFOL   Additional requests/questions:    Astrid Divine   03/19/2022, 2:20 PM

## 2022-03-21 NOTE — Telephone Encounter (Signed)
Patient with diagnosis of atrial fibrillation on Eliquis for anticoagulation.    Procedure: colonoscopy Date of procedure: TBD   CHA2DS2-VASc Score = 2   This indicates a 2.2% annual risk of stroke. The patient's score is based upon: CHF History: 1 HTN History: 0 Diabetes History: 0 Stroke History: 0 Vascular Disease History: 0 Age Score: 1 Gender Score: 0  CrCl 49 Platelet count 159  Per office protocol, patient can hold Eliquis for 2 days prior to procedure.   Patient will not need bridging with Lovenox (enoxaparin) around procedure.  **This guidance is not considered finalized until pre-operative APP has relayed final recommendations.**

## 2022-03-22 NOTE — Telephone Encounter (Signed)
Primary [place], MD  Chart reviewed as part of pre-operative protocol coverage. Because of Brian Weeks's past medical history and time since last visit, he/she will require a follow-up visit in order to better assess preoperative cardiovascular risk.  Pre-op covering staff: -Patient has appointment 04/09/2022 with Dr. Quentin Ore..  If procedure is not urgent dental clearance can be provided at th the office visit.  However procedure is urgent, would recommend telephone assessment. - Please contact requesting surgeon's office via preferred method (i.e, phone, fax) to inform them of need for appointment prior to surgery.  If applicable, this message will also be routed to pharmacy pool and/or primary cardiologist for input on holding anticoagulant/antiplatelet agent as requested below so that this information is available at time of patient's appointment.   Emmaline Life, NP-C    03/22/2022, 11:26 AM [place] [address] Office [phone] Fax [phone]

## 2022-04-09 ENCOUNTER — Ambulatory Visit: Payer: Medicare Other | Admitting: Cardiology

## 2022-04-09 ENCOUNTER — Encounter: Payer: Self-pay | Admitting: Cardiology

## 2022-04-09 VITALS — BP 110/64 | HR 67 | Ht 71.0 in | Wt 220.2 lb

## 2022-04-09 DIAGNOSIS — I5022 Chronic systolic (congestive) heart failure: Secondary | ICD-10-CM | POA: Diagnosis not present

## 2022-04-09 DIAGNOSIS — I4819 Other persistent atrial fibrillation: Secondary | ICD-10-CM

## 2022-04-09 DIAGNOSIS — Z79899 Other long term (current) drug therapy: Secondary | ICD-10-CM | POA: Diagnosis not present

## 2022-04-09 NOTE — Patient Instructions (Signed)
Medication Instructions:  Stop Amiodarone *If you need a refill on your cardiac medications before your next appointment, please call your pharmacy*   Lab Work: none If you have labs (blood work) drawn today and your tests are completely normal, you will receive your results only by: [place] (if you have MyChart) OR A paper copy in the mail If you have any lab test that is abnormal or we need to change your treatment, we will call you to review the results.   Testing/Procedures: none   Follow-Up: At [facility], you and your health needs are our priority.  As part of our continuing mission to provide you with exceptional heart care, we have created designated Provider Care Teams.  These Care Teams include your primary Cardiologist (physician) and Advanced Practice Providers (APPs -  Physician Assistants and Nurse Practitioners) who all work together to provide you with the care you need, when you need it.  We recommend signing up for the patient portal called "MyChart".  Sign up information is provided on this After Visit Summary.  MyChart is used to connect with patients for Virtual Visits (Telemedicine).  Patients are able to view lab/test results, encounter notes, upcoming appointments, etc.  Non-urgent messages can be sent to your provider as well.   To learn more about what you can do with MyChart, go to [email or web address].    Your next appointment:   6 month(s)  The format for your next appointment:   In Person  Provider:   You will see one of the following Advanced Practice Providers on your designated Care Team:   Tommye Standard, [place] Legrand Como "Jonni Sanger" Chalmers Cater, [place]   Other Instructions none  Important Information About [place]

## 2022-04-09 NOTE — Progress Notes (Signed)
Electrophysiology Office Follow up Visit Note:    Date:  04/09/2022   ID:  Oluwaferanmi, Wain 09-Dec-1950, MRN [id number]  PCP:  Kristen Loader, [place] Cardiologist:  Sherren Mocha, MD  [facility] Electrophysiologist:  Vickie Epley, MD    Interval History:    DAMEIAN CRISMAN is a 71 y.o. male who presents for a follow up visit after an A-fib ablation on Dec 14, 2021.  During his ablation, the veins and posterior wall were ablated in addition to the cavotricuspid isthmus.  He saw Audry Pili in the A-fib clinic on Jan 12, 2022.  He reported no recurrence of his arrhythmia at that appointment.  He has done well since the ablation.       Past Medical History:  Diagnosis Date   CKD (chronic kidney disease) stage 3, GFR 30-59 ml/min (HCC)    Gout    HLD (hyperlipidemia)     Past Surgical History:  Procedure Laterality Date   ATRIAL FIBRILLATION ABLATION N/A 12/14/2021   Procedure: ATRIAL FIBRILLATION ABLATION;  Surgeon: Vickie Epley, MD;  Location: [facility];  Service: Cardiovascular;  Laterality: N/A;   TEE WITHOUT CARDIOVERSION N/A 06/10/2021   Procedure: TRANSESOPHAGEAL ECHOCARDIOGRAM (TEE);  Surgeon: Werner Lean, MD;  Location: [facility] ENDOSCOPY;  Service: Cardiovascular;  Laterality: N/A;    Current Medications: Current Meds  Medication Sig   acetaminophen (TYLENOL) 325 MG tablet Take 650 mg by mouth every 6 (six) hours as needed for mild pain, moderate pain or headache.   allopurinol (ZYLOPRIM) 300 MG tablet Take 300 mg by mouth daily.   atorvastatin (LIPITOR) 10 MG tablet TAKE 1 TABLET BY MOUTH DAILY   digoxin (LANOXIN) 0.125 MG tablet TAKE 1 TABLET BY MOUTH DAILY   ELIQUIS 5 MG TABS tablet TAKE 1 TABLET BY MOUTH TWICE  DAILY   metoprolol succinate (TOPROL-XL) 25 MG 24 hr tablet Take 25 mg by mouth as needed (for heart rate greater than 110 BPM).   [DISCONTINUED] amiodarone (PACERONE) 200 MG tablet TAKE 1 TABLET BY MOUTH DAILY      Allergies:   Indomethacin   Social History   Socioeconomic History   Marital status: Married    Spouse name: [name]   Number of children: 2   Years of education: Not on file   Highest education level: Not on file  Occupational History   Not on file  Tobacco Use   Smoking status: Former    Types: Cigarettes   Smokeless tobacco: Never   Tobacco comments:    Former smoker 06/16/2021  Substance and Sexual Activity   Alcohol use: Yes    Alcohol/week: 2.0 - 3.0 standard drinks of alcohol    Types: 2 - 3 Glasses of wine per week    Comment: 2-3 glasses weekly 06/16/2021   Drug use: Never   Sexual activity: Not on file  Other Topics Concern   Not on file  Social History Narrative   Not on file   Social Determinants of Health   Financial Resource Strain: Not on file  Food Insecurity: Not on file  Transportation Needs: Not on file  Physical Activity: Not on file  Stress: Not on file  Social Connections: Not on file     Family History: The patient's family history includes Alzheimer's disease in his mother; Cancer in his brother; Healthy in his brother and sister; Heart disease in his father; Hyperlipidemia in his mother; Hypertension in his mother.  ROS:  Please see the history of present illness.    All other systems reviewed and are negative.  EKGs/Labs/Other Studies Reviewed:    The following studies were reviewed today:   EKG:  The ekg ordered today demonstrates sinus rhythm.  Recent Labs: 06/08/2021: TSH 3.281 06/18/2021: B Natriuretic Peptide 1,958.5; Magnesium 2.8 06/20/2021: ALT 226 11/27/2021: BUN 23; Creatinine, Ser 1.89; Hemoglobin 12.3; Platelets 159; Potassium 4.7; Sodium 144  Recent Lipid Panel    Component Value Date/Time   CHOL 111 06/09/2021 0152   TRIG 40 06/09/2021 0152   HDL 32 (L) 06/09/2021 0152   CHOLHDL 3.5 06/09/2021 0152   VLDL 8 06/09/2021 0152   LDLCALC 71 06/09/2021 0152    Physical Exam:    VS:  BP 110/64   Pulse 67   Ht  5\' 11"  (1.803 m)   Wt 220 lb 3.2 oz (99.9 kg)   SpO2 95%   BMI 30.71 kg/m     Wt Readings from Last 3 Encounters:  04/09/22 220 lb 3.2 oz (99.9 kg)  01/12/22 207 lb 3.2 oz (94 kg)  12/14/21 208 lb (94.3 kg)     GEN:  Well nourished, well developed in no acute distress HEENT: Normal NECK: No JVD; No carotid bruits LYMPHATICS: No lymphadenopathy CARDIAC: RRR, no murmurs, rubs, gallops RESPIRATORY:  Clear to auscultation without rales, wheezing or rhonchi  ABDOMEN: Soft, non-tender, non-distended MUSCULOSKELETAL:  No edema; No deformity  SKIN: Warm and dry NEUROLOGIC:  Alert and oriented x 3 PSYCHIATRIC:  Normal affect        ASSESSMENT:    1. Persistent atrial fibrillation ([place])   2. Chronic HFrEF (heart failure with reduced ejection fraction) ([place])   3. Encounter for long-term (current) use of high-risk medication    PLAN:    In order of problems listed above:  #Persistent atrial fibrillation Maintaining sinus rhythm after his ablation.  On Eliquis for stroke prophylaxis.  Has been on amiodarone up until this point.  At this time, given no recurrence of arrhythmia after his ablation, I think it is reasonable for him to discontinue his amiodarone.  I will plan to have him touch base with an APP in 6 months to check in on his rhythm.  If he were to have recurrence, favor repeat ablation given his history of chronic systolic heart failure.  #Chronic systolic heart failure Rhythm control indicated as above.  On metoprolol, digoxin.  NYHA class II.  Follow-up 6 months with an APP   Medication Adjustments/Labs and Tests Ordered: Current medicines are reviewed at length with the patient today.  Concerns regarding medicines are outlined above.  Orders Placed This Encounter  Procedures   EKG 12-Lead   No orders of the defined types were placed in this encounter.    Signed, Lars Mage, MD, [facility], [facility] 04/09/2022 2:58 PM    Electrophysiology [facility]

## 2022-04-29 NOTE — Telephone Encounter (Addendum)
   Patient Name: Brian Weeks  DOB: March 11, 1951 MRN: [id number]  Primary Cardiologist: Sherren Mocha, MD  Chart reviewed as part of pre-operative protocol coverage. Pharmacy clearance requested for colonoscopy. Dr. Mardene Speak OV reviewed from 04/09/22, felt to be doing well post-ablation in 12/14/21. Patient is now 4 months out from ablation. No additional concerns reported at [facility] . Per office protocol, patient can hold Eliquis for 2 days prior to procedure.  Patient will not need bridging with Lovenox (enoxaparin) around procedure.  Will route this bundled recommendation to requesting provider via Epic fax function. Please call with questions.   Charlie Pitter, PA-C 04/29/2022, 9:55 AM

## 2022-05-03 DIAGNOSIS — N1832 Chronic kidney disease, stage 3b: Secondary | ICD-10-CM | POA: Diagnosis not present

## 2022-05-06 ENCOUNTER — Other Ambulatory Visit: Payer: Self-pay | Admitting: Physician Assistant

## 2022-05-14 DIAGNOSIS — N1832 Chronic kidney disease, stage 3b: Secondary | ICD-10-CM | POA: Diagnosis not present

## 2022-05-14 DIAGNOSIS — I5020 Unspecified systolic (congestive) heart failure: Secondary | ICD-10-CM | POA: Diagnosis not present

## 2022-05-14 DIAGNOSIS — M109 Gout, unspecified: Secondary | ICD-10-CM | POA: Diagnosis not present

## 2022-05-14 DIAGNOSIS — E785 Hyperlipidemia, unspecified: Secondary | ICD-10-CM | POA: Diagnosis not present

## 2022-05-14 DIAGNOSIS — I480 Paroxysmal atrial fibrillation: Secondary | ICD-10-CM | POA: Diagnosis not present

## 2022-06-07 ENCOUNTER — Other Ambulatory Visit: Payer: Self-pay | Admitting: Physician Assistant

## 2022-07-13 ENCOUNTER — Telehealth: Payer: Self-pay | Admitting: Cardiology

## 2022-07-13 NOTE — Telephone Encounter (Signed)
Follow Up:   Brian Weeks is calling to check on the status of clearance that was sent on 03-19-22. Please fax this asap to [phone]. Surgery is scheduled for 07-19-22.

## 2022-07-14 NOTE — Telephone Encounter (Signed)
     Primary Cardiologist: Sherren Mocha, MD  Chart reviewed as part of pre-operative protocol coverage. Given past medical history and time since last visit, based on ACC/AHA guidelines, Brian Weeks would be at acceptable risk for the planned procedure without further cardiovascular testing.   His Eliquis may be held for 2 days prior to his procedure.  He will not need bridging with Lovenox surrounding his procedure.  Please resume as soon as hemostasis is achieved.  I will route this recommendation to the requesting party via Epic fax function and remove from pre-op pool.  Please call with questions.  Jossie Ng. Joedy Eickhoff NP-C     07/14/2022, 4:00 PM [place] [address] Office [phone] Fax [phone]

## 2022-07-14 NOTE — Telephone Encounter (Signed)
I will ask pre op provider to review notes from 04/09/22 Dr. Quentin Ore. Clearance notes 03/19/22.

## 2022-07-15 ENCOUNTER — Other Ambulatory Visit: Payer: Self-pay | Admitting: Physician Assistant

## 2022-07-16 DIAGNOSIS — I4819 Other persistent atrial fibrillation: Secondary | ICD-10-CM | POA: Diagnosis not present

## 2022-07-16 DIAGNOSIS — N184 Chronic kidney disease, stage 4 (severe): Secondary | ICD-10-CM | POA: Diagnosis not present

## 2022-07-16 DIAGNOSIS — L299 Pruritus, unspecified: Secondary | ICD-10-CM | POA: Diagnosis not present

## 2022-07-16 DIAGNOSIS — E782 Mixed hyperlipidemia: Secondary | ICD-10-CM | POA: Diagnosis not present

## 2022-07-16 DIAGNOSIS — D6869 Other thrombophilia: Secondary | ICD-10-CM | POA: Diagnosis not present

## 2022-07-16 MED ORDER — ATORVASTATIN CALCIUM 10 MG PO TABS: 10.0000 mg | ORAL_TABLET | Freq: Every day | ORAL | 0 refills | Status: DC

## 2022-07-19 DIAGNOSIS — D123 Benign neoplasm of transverse colon: Secondary | ICD-10-CM | POA: Diagnosis not present

## 2022-07-19 DIAGNOSIS — K648 Other hemorrhoids: Secondary | ICD-10-CM | POA: Diagnosis not present

## 2022-07-19 DIAGNOSIS — Z1211 Encounter for screening for malignant neoplasm of colon: Secondary | ICD-10-CM | POA: Diagnosis not present

## 2022-07-21 DIAGNOSIS — D123 Benign neoplasm of transverse colon: Secondary | ICD-10-CM | POA: Diagnosis not present

## 2022-08-19 ENCOUNTER — Other Ambulatory Visit: Payer: Self-pay | Admitting: Cardiovascular Disease

## 2022-09-06 ENCOUNTER — Telehealth: Payer: Self-pay | Admitting: Cardiovascular Disease

## 2022-09-06 NOTE — Telephone Encounter (Signed)
Spoke with the patient who states that he has been wearing a compression sleeve on his knee and ever since his heart rate has been elevated 90's-110. He states that he took the sleeve off and his heart rate has been in the upper 80s low 90s. He states that he is feeling better. Advised that he can take his metoprolol as needed for heart rates over 110. Advised to continue to monitor symptoms and heart rate and let us know if anything changes.

## 2022-09-06 NOTE — Telephone Encounter (Signed)
STAT if patient feels like he/she is going to faint   Are you dizzy now? No, resting right now  Do you feel faint or have you passed out? No  Do you have any other symptoms? Not feeling good, sluggish and lightheaded  Have you checked your HR and BP (record if available)? HR: 104, 95, 94, 96  Patient states that a few days ago he was having knee pain, he bought a sleeve for his knee and has felt dizzy since. He checked his HR today and it was 104. He took the sleeve off and it dropped to 94-96. He wants to know if the sleeve could be causing these symptoms. Please advise.

## 2022-09-28 ENCOUNTER — Other Ambulatory Visit: Payer: Self-pay | Admitting: Cardiovascular Disease

## 2022-10-11 NOTE — Progress Notes (Unsigned)
  Electrophysiology Office Follow up Visit Note:    Date:  10/11/2022   ID:  Brian Weeks, Brian Weeks 09-11-1950, MRN [id number]  PCP:  Kristen Loader, [place] Cardiologist:  Sherren Mocha, MD  [facility] Electrophysiologist:  Vickie Epley, MD    Interval History:    Brian Weeks is a 72 y.o. male who presents for a follow up visit.   Last seen April 09, 2022.  He had an A-fib ablation Dec 14, 2021.  The veins, posterior wall and CTI were ablated.  He had not had recurrence at the time of my last appointment.       Past medical, surgical, social and family history were reviewed.  ROS:   Please see the history of present illness.    All other systems reviewed and are negative.  EKGs/Labs/Other Studies Reviewed:    The following studies were reviewed today:    EKG:  The ekg ordered today demonstrates ***   Physical Exam:    VS:  There were no vitals taken for this visit.    Wt Readings from Last 3 Encounters:  04/09/22 220 lb 3.2 oz (99.9 kg)  01/12/22 207 lb 3.2 oz (94 kg)  12/14/21 208 lb (94.3 kg)     GEN: *** Well nourished, well developed in no acute distress CARDIAC: ***RRR, no murmurs, rubs, gallops RESPIRATORY:  Clear to auscultation without rales, wheezing or rhonchi       ASSESSMENT:    1. Persistent atrial fibrillation ([place])   2. Chronic HFrEF (heart failure with reduced ejection fraction) (HCC)    PLAN:    In order of problems listed above:   #Persistent atrial fibrillation Post ablation of the veins, posterior wall and CTI on Dec 14, 2021.  Largely maintaining sinus rhythm after the catheter ablation.  Continue Eliquis for stroke prophylaxis.  #Chronic systolic heart failure Continue current medications.  NYHA class II.  Warm and dry on exam.  Follow-up 1 year with APP.       Total time spent with patient today *** minutes. This includes reviewing records, evaluating the patient and coordinating care.     Signed, Lars Mage, MD, [facility], [facility] 10/11/2022 10:12 PM    Electrophysiology [facility]

## 2022-10-12 ENCOUNTER — Ambulatory Visit: Payer: Medicare Other | Attending: Cardiology | Admitting: Cardiology

## 2022-10-12 ENCOUNTER — Encounter: Payer: Self-pay | Admitting: Cardiology

## 2022-10-12 VITALS — BP 116/74 | HR 64 | Ht 71.0 in | Wt 226.0 lb

## 2022-10-12 DIAGNOSIS — I4819 Other persistent atrial fibrillation: Secondary | ICD-10-CM | POA: Diagnosis not present

## 2022-10-12 DIAGNOSIS — I5022 Chronic systolic (congestive) heart failure: Secondary | ICD-10-CM

## 2022-10-12 NOTE — Progress Notes (Signed)
  Electrophysiology Office Follow up Visit Note:    Date:  10/12/2022   ID:  Brian Weeks, Brian Weeks Dec 20, 1950, MRN [id number]  PCP:  Kristen Loader, [place] Cardiologist:  Sherren Mocha, MD  [facility] Electrophysiologist:  Vickie Epley, MD    Interval History:    Brian Weeks is a 72 y.o. male who presents for a follow up visit.   Last seen April 09, 2022.  He had an A-fib ablation Dec 14, 2021.  The veins, posterior wall and CTI were ablated.  He had not had recurrence at the time of my last appointment.  Today, he is accompanied by a family member. He states he is feeling good with no recurrent arrhythmias.   He reports gaining some weight recently which he attributes to his diet. Of note, he endorses significant weight loss following his ablation. He was losing 1-2 lbs daily down to about 200 lbs. In clinic today he weighs 226 lbs. Generally he does try to monitor what he eats. He continues to work on dietary changes and increasing his exercise.  He denies any palpitations, chest pain, shortness of breath, or peripheral edema. No lightheadedness, headaches, syncope, orthopnea, or PND.     Past medical, surgical, social and family history were reviewed.  ROS:   Please see the history of present illness.    All other systems reviewed and are negative.  EKGs/Labs/Other Studies Reviewed:    The following studies were reviewed today:    EKG:  The ekg ordered today demonstrates sinus rhythm.   Physical Exam:    VS:  BP 116/74   Pulse 64   Ht 5' 11"$  (1.803 m)   Wt 226 lb (102.5 kg)   SpO2 98%   BMI 31.52 kg/m     Wt Readings from Last 3 Encounters:  10/12/22 226 lb (102.5 kg)  04/09/22 220 lb 3.2 oz (99.9 kg)  01/12/22 207 lb 3.2 oz (94 kg)     GEN:  Well nourished, well developed in no acute distress CARDIAC: RRR, no murmurs, rubs, gallops RESPIRATORY:  Clear to auscultation without rales, wheezing or rhonchi       ASSESSMENT:     1. Persistent atrial fibrillation ([place])   2. Chronic HFrEF (heart failure with reduced ejection fraction) (HCC)    PLAN:    In order of problems listed above:   #Persistent atrial fibrillation Post ablation of the veins, posterior wall and CTI on Dec 14, 2021.  Largely maintaining sinus rhythm after the catheter ablation.  Continue Eliquis for stroke prophylaxis. Stop digoxin today  #Chronic systolic heart failure Continue current medications.  NYHA class II.  Warm and dry on exam.  Follow-up 1 year with APP.    I,Mathew Stumpf,acting as a Education administrator for Vickie Epley, MD.,have documented all relevant documentation on the behalf of Vickie Epley, MD,as directed by  Vickie Epley, MD while in the presence of Vickie Epley, MD.  I, Vickie Epley, MD, have reviewed all documentation for this visit. The documentation on 10/12/22 for the exam, diagnosis, procedures, and orders are all accurate and complete.   Signed, Lars Mage, MD, [facility], [facility] 10/12/2022 12:13 PM    Electrophysiology [facility]

## 2022-10-12 NOTE — Patient Instructions (Signed)
Medication Instructions:  Your physician has recommended you make the following change in your medication:  1) STOP taking digoxin  *If you need a refill on your cardiac medications before your next appointment, please call your pharmacy*  Follow-Up: At [facility], you and your health needs are our priority.  As part of our continuing mission to provide you with exceptional heart care, we have created designated Provider Care Teams.  These Care Teams include your primary Cardiologist (physician) and Advanced Practice Providers (APPs -  Physician Assistants and Nurse Practitioners) who all work together to provide you with the care you need, when you need it.  Your next appointment:   1 year(s)  Provider:   You may see Vickie Epley, MD or one of the following Advanced Practice Providers on your designated Care Team:   Tommye Standard, [place] Beryle Beams" [place], [place], NP

## 2022-11-01 DIAGNOSIS — I480 Paroxysmal atrial fibrillation: Secondary | ICD-10-CM | POA: Diagnosis not present

## 2022-11-01 DIAGNOSIS — E785 Hyperlipidemia, unspecified: Secondary | ICD-10-CM | POA: Diagnosis not present

## 2022-11-01 DIAGNOSIS — N1832 Chronic kidney disease, stage 3b: Secondary | ICD-10-CM | POA: Diagnosis not present

## 2022-11-01 DIAGNOSIS — I5020 Unspecified systolic (congestive) heart failure: Secondary | ICD-10-CM | POA: Diagnosis not present

## 2022-11-01 DIAGNOSIS — M109 Gout, unspecified: Secondary | ICD-10-CM | POA: Diagnosis not present

## 2022-12-29 ENCOUNTER — Other Ambulatory Visit: Payer: Self-pay | Admitting: Cardiovascular Disease

## 2022-12-29 DIAGNOSIS — I4891 Unspecified atrial fibrillation: Secondary | ICD-10-CM

## 2022-12-29 NOTE — Telephone Encounter (Signed)
Eliquis 5mg  refill request received. Patient is 72 years old, weight-102.5kg, Crea-1.73 on 11/02/22 via [employer], Diagnosis-Afib, and last seen by Dr. Lalla Brothers on 10/12/22. Dose is appropriate based on dosing criteria. Will send in refill to requested pharmacy.

## 2023-01-07 DIAGNOSIS — E213 Hyperparathyroidism, unspecified: Secondary | ICD-10-CM | POA: Diagnosis not present

## 2023-01-07 DIAGNOSIS — E782 Mixed hyperlipidemia: Secondary | ICD-10-CM | POA: Diagnosis not present

## 2023-01-07 DIAGNOSIS — D6869 Other thrombophilia: Secondary | ICD-10-CM | POA: Diagnosis not present

## 2023-01-07 DIAGNOSIS — M109 Gout, unspecified: Secondary | ICD-10-CM | POA: Diagnosis not present

## 2023-01-07 DIAGNOSIS — D638 Anemia in other chronic diseases classified elsewhere: Secondary | ICD-10-CM | POA: Diagnosis not present

## 2023-01-07 DIAGNOSIS — Z0000 Encounter for general adult medical examination without abnormal findings: Secondary | ICD-10-CM | POA: Diagnosis not present

## 2023-01-07 DIAGNOSIS — N1832 Chronic kidney disease, stage 3b: Secondary | ICD-10-CM | POA: Diagnosis not present

## 2023-01-07 DIAGNOSIS — I5022 Chronic systolic (congestive) heart failure: Secondary | ICD-10-CM | POA: Diagnosis not present

## 2023-01-07 DIAGNOSIS — I4819 Other persistent atrial fibrillation: Secondary | ICD-10-CM | POA: Diagnosis not present

## 2023-01-21 DIAGNOSIS — E039 Hypothyroidism, unspecified: Secondary | ICD-10-CM | POA: Diagnosis not present

## 2023-02-18 IMAGING — CR DG FOOT COMPLETE 3+V*R*
3 series · 3 of 3 positions shown · non-contrast
Comparison: None.

CLINICAL DATA: Metatarsal pain, no injury, gout.

EXAM:
RIGHT FOOT COMPLETE - 3+ VIEW

[t foot ap right]
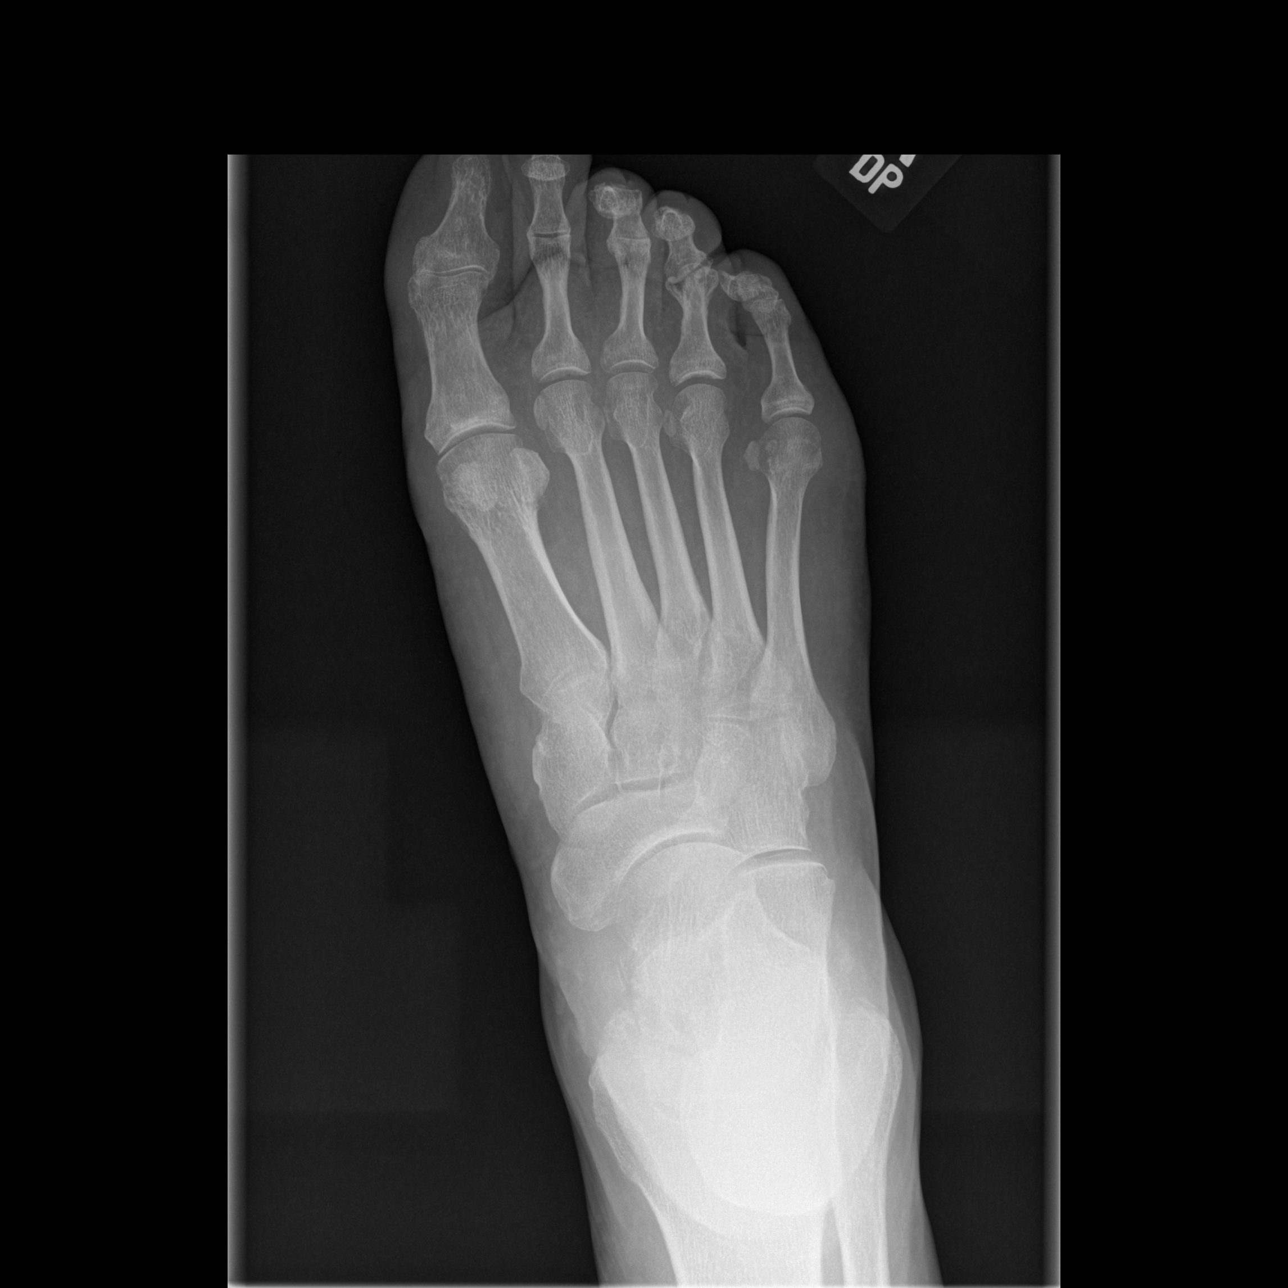

[t foot oblique right]
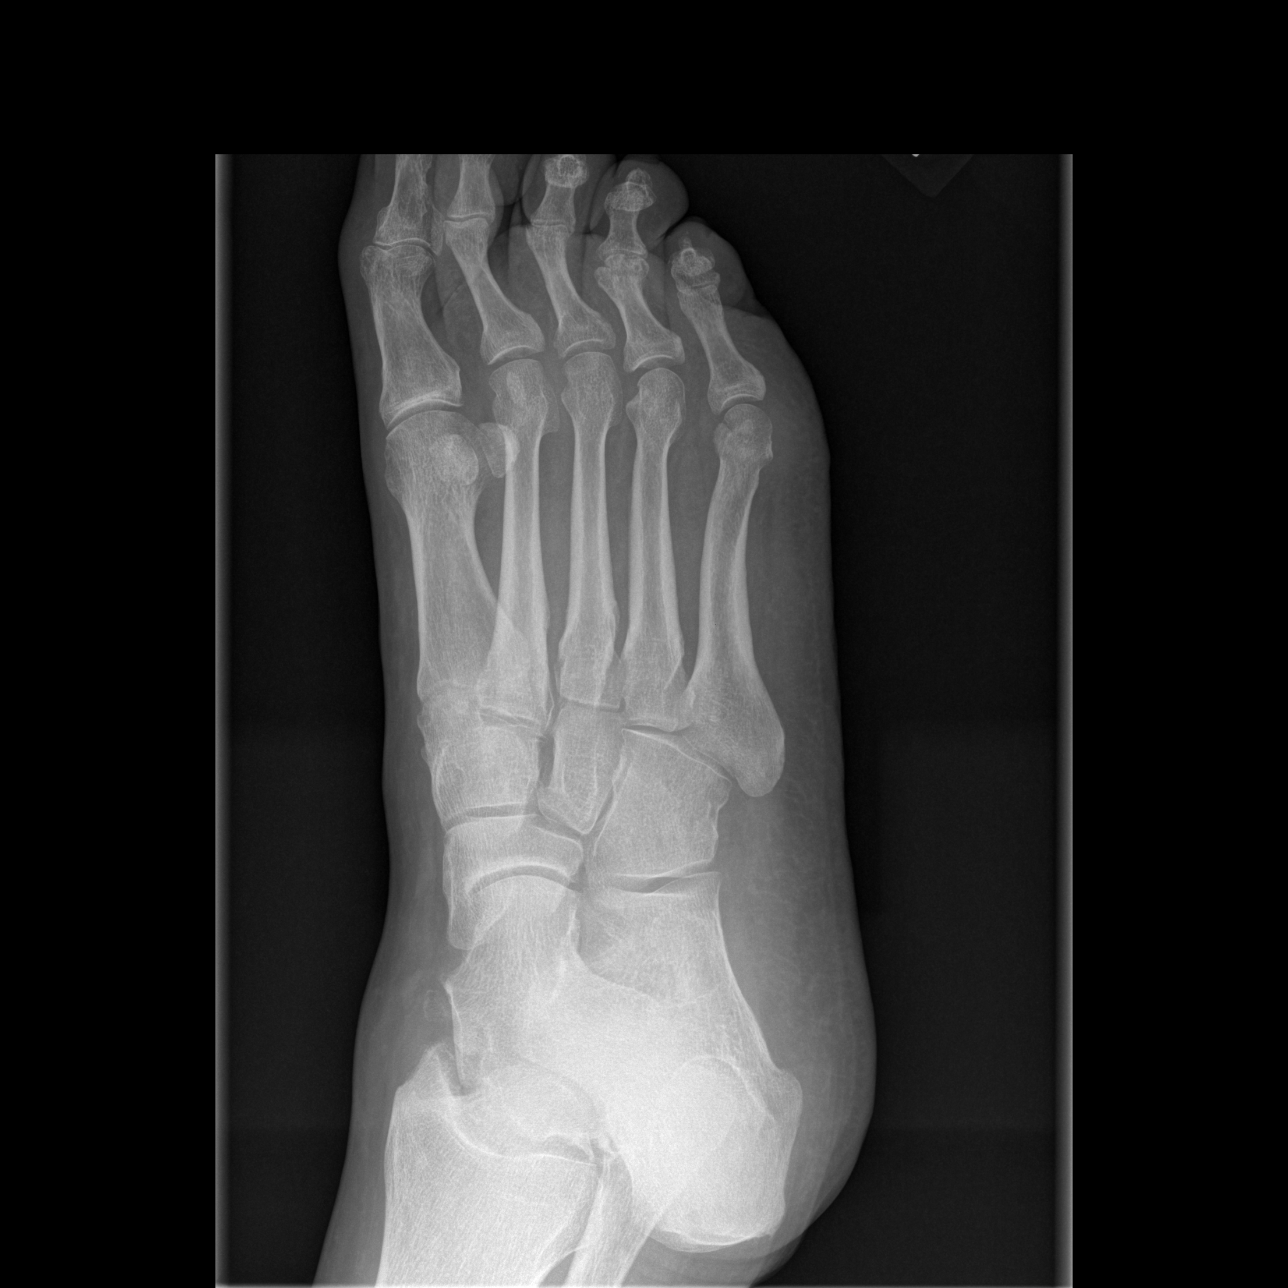

[t foot lat right]
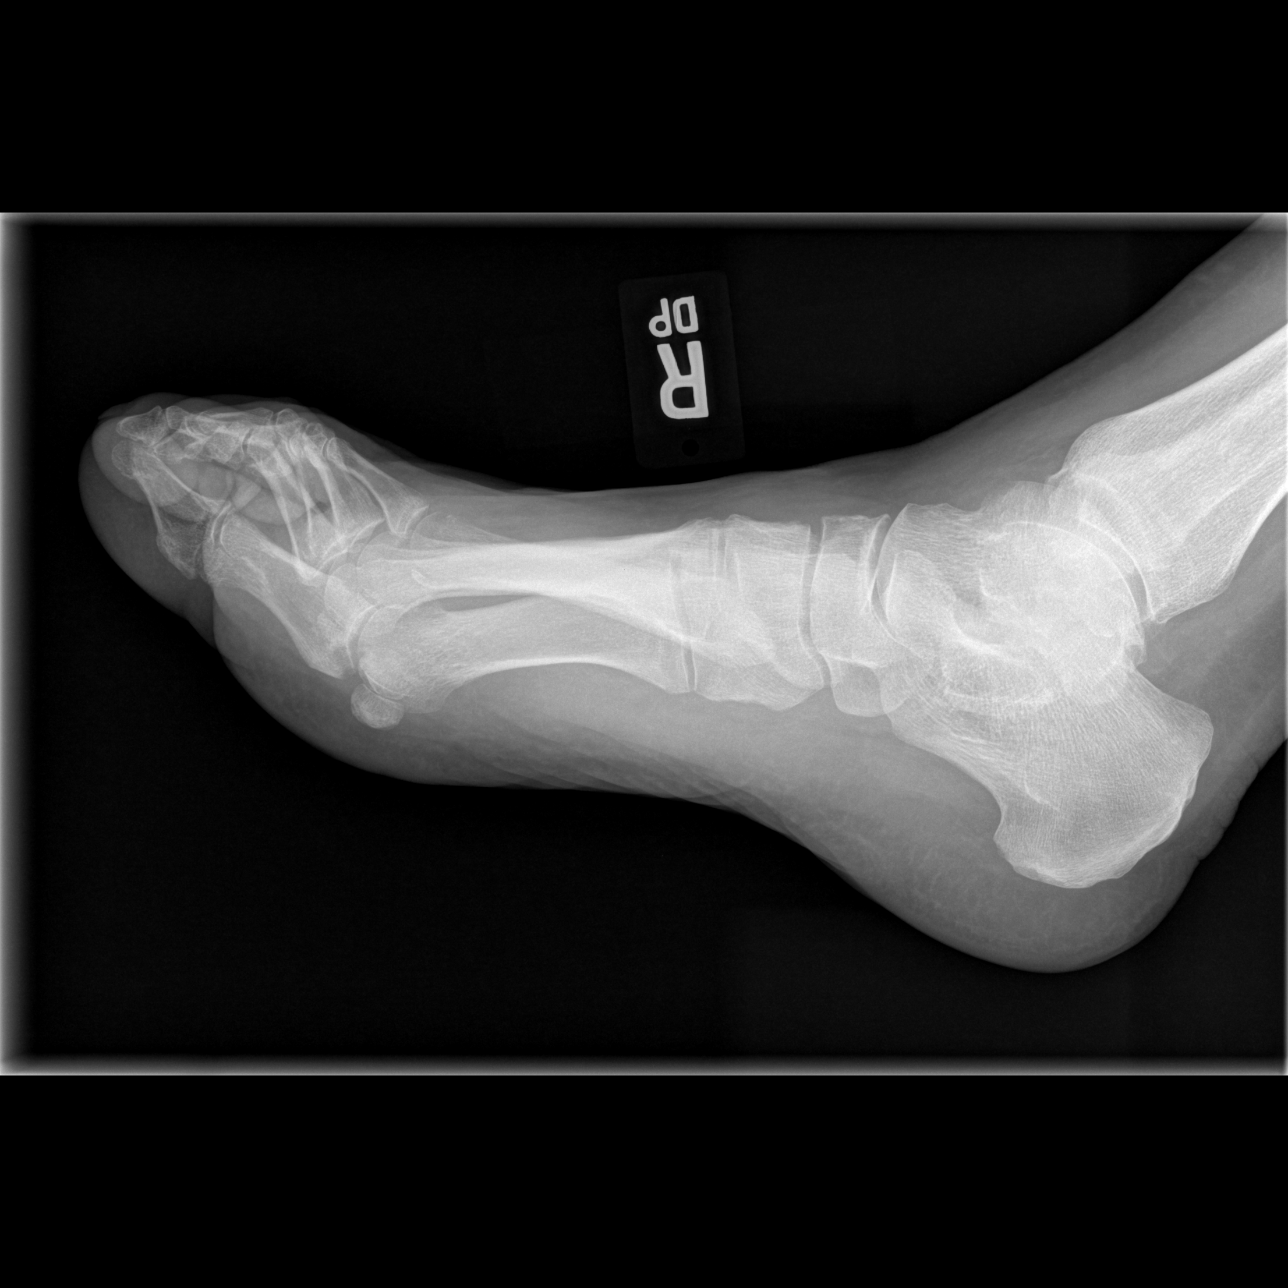

[3 of 3 positions shown; findings below may reference images not displayed]

FINDINGS: No acute osseous or joint abnormality. No erosions with overhanging
edges. There may be degenerative changes at the fourth proximal
interphalangeal joint versus previous trauma. Very minimal
degenerative changes at the first metatarsophalangeal joint.
IMPRESSION: No findings to explain the patient's metatarsal pain.

## 2023-03-18 DIAGNOSIS — T189XXA Foreign body of alimentary tract, part unspecified, initial encounter: Secondary | ICD-10-CM | POA: Diagnosis not present

## 2023-05-03 DIAGNOSIS — I5020 Unspecified systolic (congestive) heart failure: Secondary | ICD-10-CM | POA: Diagnosis not present

## 2023-05-03 DIAGNOSIS — N1832 Chronic kidney disease, stage 3b: Secondary | ICD-10-CM | POA: Diagnosis not present

## 2023-05-03 DIAGNOSIS — E785 Hyperlipidemia, unspecified: Secondary | ICD-10-CM | POA: Diagnosis not present

## 2023-05-03 DIAGNOSIS — M109 Gout, unspecified: Secondary | ICD-10-CM | POA: Diagnosis not present

## 2023-05-03 DIAGNOSIS — I480 Paroxysmal atrial fibrillation: Secondary | ICD-10-CM | POA: Diagnosis not present

## 2023-07-11 DIAGNOSIS — N1832 Chronic kidney disease, stage 3b: Secondary | ICD-10-CM | POA: Diagnosis not present

## 2023-07-11 DIAGNOSIS — I5022 Chronic systolic (congestive) heart failure: Secondary | ICD-10-CM | POA: Diagnosis not present

## 2023-07-11 DIAGNOSIS — E782 Mixed hyperlipidemia: Secondary | ICD-10-CM | POA: Diagnosis not present

## 2023-07-11 DIAGNOSIS — I4819 Other persistent atrial fibrillation: Secondary | ICD-10-CM | POA: Diagnosis not present

## 2023-07-11 DIAGNOSIS — G4700 Insomnia, unspecified: Secondary | ICD-10-CM | POA: Diagnosis not present

## 2023-07-11 DIAGNOSIS — D638 Anemia in other chronic diseases classified elsewhere: Secondary | ICD-10-CM | POA: Diagnosis not present

## 2023-07-11 DIAGNOSIS — D6869 Other thrombophilia: Secondary | ICD-10-CM | POA: Diagnosis not present

## 2023-08-02 ENCOUNTER — Other Ambulatory Visit: Payer: Self-pay | Admitting: Cardiology

## 2023-08-02 DIAGNOSIS — I4891 Unspecified atrial fibrillation: Secondary | ICD-10-CM

## 2023-08-02 NOTE — Telephone Encounter (Signed)
Prescription refill request for Eliquis received. Indication:afib Last office visit:2/24 Scr:1.5  9/24 Age: 72 Weight:102.5  kg  Prescription refilled

## 2023-08-22 DIAGNOSIS — Z7901 Long term (current) use of anticoagulants: Secondary | ICD-10-CM | POA: Diagnosis not present

## 2023-08-22 DIAGNOSIS — K91840 Postprocedural hemorrhage and hematoma of a digestive system organ or structure following a digestive system procedure: Secondary | ICD-10-CM | POA: Diagnosis not present

## 2023-08-27 DIAGNOSIS — I4891 Unspecified atrial fibrillation: Secondary | ICD-10-CM | POA: Diagnosis not present

## 2023-08-27 DIAGNOSIS — R5383 Other fatigue: Secondary | ICD-10-CM | POA: Diagnosis not present

## 2023-08-27 DIAGNOSIS — R531 Weakness: Secondary | ICD-10-CM | POA: Diagnosis not present

## 2023-08-27 DIAGNOSIS — Z7901 Long term (current) use of anticoagulants: Secondary | ICD-10-CM | POA: Diagnosis not present

## 2023-08-27 DIAGNOSIS — I509 Heart failure, unspecified: Secondary | ICD-10-CM | POA: Diagnosis not present

## 2023-08-27 DIAGNOSIS — E785 Hyperlipidemia, unspecified: Secondary | ICD-10-CM | POA: Diagnosis not present

## 2023-08-27 DIAGNOSIS — R55 Syncope and collapse: Secondary | ICD-10-CM | POA: Diagnosis not present

## 2023-08-27 DIAGNOSIS — I951 Orthostatic hypotension: Secondary | ICD-10-CM | POA: Diagnosis not present

## 2023-08-27 DIAGNOSIS — N189 Chronic kidney disease, unspecified: Secondary | ICD-10-CM | POA: Diagnosis not present

## 2023-08-27 DIAGNOSIS — I130 Hypertensive heart and chronic kidney disease with heart failure and stage 1 through stage 4 chronic kidney disease, or unspecified chronic kidney disease: Secondary | ICD-10-CM | POA: Diagnosis not present

## 2023-08-28 DIAGNOSIS — N189 Chronic kidney disease, unspecified: Secondary | ICD-10-CM | POA: Diagnosis not present

## 2023-08-28 DIAGNOSIS — Z79899 Other long term (current) drug therapy: Secondary | ICD-10-CM | POA: Diagnosis not present

## 2023-08-28 DIAGNOSIS — Z7901 Long term (current) use of anticoagulants: Secondary | ICD-10-CM | POA: Diagnosis not present

## 2023-08-28 DIAGNOSIS — I509 Heart failure, unspecified: Secondary | ICD-10-CM | POA: Diagnosis not present

## 2023-08-28 DIAGNOSIS — I4891 Unspecified atrial fibrillation: Secondary | ICD-10-CM | POA: Diagnosis not present

## 2023-08-28 DIAGNOSIS — K068 Other specified disorders of gingiva and edentulous alveolar ridge: Secondary | ICD-10-CM | POA: Diagnosis not present

## 2023-08-28 DIAGNOSIS — E785 Hyperlipidemia, unspecified: Secondary | ICD-10-CM | POA: Diagnosis not present

## 2023-08-28 DIAGNOSIS — K91840 Postprocedural hemorrhage and hematoma of a digestive system organ or structure following a digestive system procedure: Secondary | ICD-10-CM | POA: Diagnosis not present

## 2023-10-06 NOTE — Progress Notes (Deleted)
 Cardiology Office Note:  .   Date:  10/06/2023  ID:  Brian Weeks, DOB 11-24-1950, MRN [id number] PCP: Brian Pilon, FNP  [facility] Providers Cardiologist:  Tonny Bollman, MD Electrophysiologist:  Lanier Prude, MD {  History of Present Illness: .   Brian Weeks is a 73 y.o. male w/PMHx of HLD, HFrEF, AFib, CKD (IIIb),   Afib 2022 found associated with reduced LVEF/acute HF  admitted to the hospital 06/18/2021 with AKI with Crt 4.58.  He became bradycardic and hypotensive and held his beta-blocker in the hospital.  Also had elevated liver function tests possibly secondary to hypotensive episode with mild ischemic liver injury. Crt 2.22 at discharge. Zio monitor showed 21% afib with avg HR of 109   Referred to EP, saw Dr. Lalla Brothers planned for ablation  Feb 2023 > LVEF had recovered to 50-55%  AFib ablation 12/14/21  Last saw dr. Lalla Brothers 10/12/22, had some success with weight loss 2023 > though unfortunately creeping back up, blaming his diet. >> working on this No AFib symptoms Not felt to be volume OL Dig stopped   Today's visit is scheduled as an annual visit  ROS:   *** Needs gen cards, last was Nov 2022 *** eliquis, dose, bleeding, lasbs *** symptoms  Arrhythmia/AAD hx AFib found Oct 2022  Dig/Amiodarone Oct 2022   PVI/posterior wall/CIT ablation 12/14/21 Amiodarone stopped Aug 2023 post ablation Dig stopped Feb 2024  Studies Reviewed: Marland Kitchen    EKG done today and reviewed by myself:  ***  12/14/21: EPS/ablation CONCLUSIONS: 1. Successful PVI 2. Successful ablation/isolation of the posterior wall 3.  Successful ablation of the cavotricuspid isthmus for typical atrial flutter 4. Intracardiac echo reveals normal left atrial architecture, trivial pericardial effusion 5. No early apparent complications.   12/08/21: Cardiac CT IMPRESSION: 1. There is normal pulmonary vein drainage into the left atrium with ostial measurements above. 2. There  is no thrombus in the left atrial appendage. 3. The esophagus runs in the left atrial midline and is not in proximity to any of the pulmonary vein ostia. 4. No PFO/ASD. 5. Normal coronary origin. Right dominance. 6. CAC score of 0.   09/23/21: TTE 1. Left ventricular ejection fraction, by estimation, is 50 to 55%. Left  ventricular ejection fraction by 3D volume is 52 %. The left ventricle has  low normal function. The left ventricle has no regional wall motion  abnormalities. Left ventricular  diastolic parameters are consistent with Grade I diastolic dysfunction  (impaired relaxation).   2. Right ventricular systolic function is normal. The right ventricular  size is normal. Tricuspid regurgitation signal is inadequate for assessing  PA pressure.   3. The mitral valve is grossly normal. Trivial mitral valve  regurgitation.   4. The aortic valve is tricuspid. Aortic valve regurgitation is not  visualized.   5. The inferior vena cava is normal in size with greater than 50%  respiratory variability, suggesting right atrial pressure of 3 mmHg.   Comparison(s): Changes from prior study are noted. 06/09/2021: LVEF  20-25%, global hypokinesis.    Risk Assessment/Calculations:    Physical Exam:   VS:  There were no vitals taken for this visit.   Wt Readings from Last 3 Encounters:  10/12/22 226 lb (102.5 kg)  04/09/22 220 lb 3.2 oz (99.9 kg)  01/12/22 207 lb 3.2 oz (94 kg)    GEN: Well nourished, well developed in no acute distress NECK: No JVD; No carotid bruits CARDIAC: ***RRR, no murmurs,  rubs, gallops RESPIRATORY:  *** CTA b/l without rales, wheezing or rhonchi  ABDOMEN: Soft, non-tender, non-distended EXTREMITIES: *** No edema; No deformity   ASSESSMENT AND PLAN: .    persistent AFib CHA2DS2Vasc is2, on Eliquis, *** appropriately dosed *** burden by symptoms Rhythm control important given hx of his NICM   NICM Suspect 2/2 RVR Recovered LVEF by his echo  2023 ***  Secondary hypercoagulable state 2/2 AFib     {Are you ordering a CV Procedure (e.g. stress test, cath, DCCV, TEE, etc)?   Press F2        :[id number]}     Dispo: ***  Signed, Sheilah Pigeon, PA-C

## 2023-10-11 ENCOUNTER — Ambulatory Visit: Payer: Medicare Other | Admitting: Physician Assistant

## 2023-10-23 ENCOUNTER — Other Ambulatory Visit: Payer: Self-pay | Admitting: Cardiovascular Disease

## 2023-11-02 NOTE — Progress Notes (Unsigned)
 Cardiology Office Note:  .   Date:  11/02/2023  ID:  Brian Weeks, DOB [birth year], MRN [id number] PCP: Soundra Pilon, FNP  [facility] Providers Cardiologist:  Tonny Bollman, MD Electrophysiologist:  Lanier Prude, MD {  History of Present Illness: .   Brian Weeks is a 73 y.o. male w/PMHx of HLD, HFrEF, AFib, CKD (IIIb),   Afib 2022 found associated with reduced LVEF/acute HF  admitted to the hospital 06/18/2021 with AKI with Crt 4.58.  He became bradycardic and hypotensive and held his beta-blocker in the hospital.  Also had elevated liver function tests possibly secondary to hypotensive episode with mild ischemic liver injury. Crt 2.22 at discharge. Zio monitor showed 21% afib with avg HR of 109   Referred to EP, saw Dr. Lalla Brothers planned for ablation  Feb 2023 > LVEF had recovered to 50-55%  AFib ablation 12/14/21  Last saw Dr. Lalla Brothers 10/12/22, had some success with weight loss 2023 > though unfortunately creeping back up, blaming his diet. >> working on this No AFib symptoms Not felt to be volume OL Dig stopped  ER visit [facility] 08/27/23 > bleeding after a dental extraction, weak, near syncope 86/61 H/H 12.7/40   Today's visit is scheduled as an annual visit  ROS:   Generally feels well Says 80-90% of the time he feels good, the other time wishes he had more energy/stamina Most of the time sleeps well Denies snoring/signs of sleep apnea No CP, palpitations or cardiac awareness No Afib No SOB No near syncope or syncope, but sometimes feels a bit lightheaded  Reminisces about his dental extraction and terrible bleeding her had, in hindsight did not stop his Eiquis ahead of time, can't recall if he was advised to Did end up hold it a few days afterwards/because of the bleeding   Arrhythmia/AAD hx AFib found Oct 2022  Dig/Amiodarone Oct 2022   PVI/posterior wall/CIT ablation 12/14/21 Amiodarone stopped Aug 2023 post ablation Dig stopped Feb  2024  Studies Reviewed: Marland Kitchen    EKG done today and reviewed by myself:  SR 72bpm  12/14/21: EPS/ablation CONCLUSIONS: 1. Successful PVI 2. Successful ablation/isolation of the posterior wall 3.  Successful ablation of the cavotricuspid isthmus for typical atrial flutter 4. Intracardiac echo reveals normal left atrial architecture, trivial pericardial effusion 5. No early apparent complications.   12/08/21: Cardiac CT IMPRESSION: 1. There is normal pulmonary vein drainage into the left atrium with ostial measurements above. 2. There is no thrombus in the left atrial appendage. 3. The esophagus runs in the left atrial midline and is not in proximity to any of the pulmonary vein ostia. 4. No PFO/ASD. 5. Normal coronary origin. Right dominance. 6. CAC score of 0.   09/23/21: TTE 1. Left ventricular ejection fraction, by estimation, is 50 to 55%. Left  ventricular ejection fraction by 3D volume is 52 %. The left ventricle has  low normal function. The left ventricle has no regional wall motion  abnormalities. Left ventricular  diastolic parameters are consistent with Grade I diastolic dysfunction  (impaired relaxation).   2. Right ventricular systolic function is normal. The right ventricular  size is normal. Tricuspid regurgitation signal is inadequate for assessing  PA pressure.   3. The mitral valve is grossly normal. Trivial mitral valve  regurgitation.   4. The aortic valve is tricuspid. Aortic valve regurgitation is not  visualized.   5. The inferior vena cava is normal in size with greater than 50%  respiratory variability, suggesting  right atrial pressure of 3 mmHg.   Comparison(s): Changes from prior study are noted. 06/09/2021: LVEF  20-25%, global hypokinesis.    Risk Assessment/Calculations:    Physical Exam:   VS:  There were no vitals taken for this visit.   Wt Readings from Last 3 Encounters:  10/12/22 226 lb (102.5 kg)  04/09/22 220 lb 3.2 oz (99.9 kg)   01/12/22 207 lb 3.2 oz (94 kg)    GEN: Well nourished, well developed in no acute distress NECK: No JVD; No carotid bruits CARDIAC: RRR, no murmurs, rubs, gallops RESPIRATORY:  CTA b/l without rales, wheezing or rhonchi  ABDOMEN: Soft, non-tender, non-distended EXTREMITIES: No edema; No deformity   ASSESSMENT AND PLAN: .    persistent AFib CHA2DS2Vasc is2, on Eliquis, appropriately dosed no burden by symptoms Rhythm control important given hx of his NICM   NICM Suspect 2/2 RVR Recovered LVEF by his echo 2023 No symptoms or exam findings of volume OL  Secondary hypercoagulable state 2/2 AFib  4. Occassional fatigue, lack of stamina Not SOB, DOE Just runs out of gas/tired sometimes, not regularly/routinely, but sometimes Does not think he has sleep apnea Will get basic labs Advised to see his PMD to discuss this further  5. Low BP Recheck by myself is 108/60 He thinks he drinks enough/adequate water Generally he finds his BP 110's  Dispo: back in a year, sooner if needed  Signed, Sheilah Pigeon, PA-C

## 2023-11-03 ENCOUNTER — Encounter: Payer: Self-pay | Admitting: Physician Assistant

## 2023-11-03 ENCOUNTER — Ambulatory Visit: Attending: Physician Assistant | Admitting: Physician Assistant

## 2023-11-03 VITALS — BP 100/64 | HR 72 | Ht 71.0 in | Wt 232.4 lb

## 2023-11-03 DIAGNOSIS — I428 Other cardiomyopathies: Secondary | ICD-10-CM

## 2023-11-03 DIAGNOSIS — I4819 Other persistent atrial fibrillation: Secondary | ICD-10-CM

## 2023-11-03 DIAGNOSIS — D6869 Other thrombophilia: Secondary | ICD-10-CM

## 2023-11-03 NOTE — Patient Instructions (Addendum)
 Medication Instructions:   Your physician recommends that you continue on your current medications as directed. Please refer to the Current Medication list given to you today.   *If you need a refill on your cardiac medications before your next appointment, please call your pharmacy*    Lab Work:  PLEASE GO DOWN STAIRS  [facility]  FIRST FLOOR  [address] ( GET OFF ELEVATORS MAKE A LEFT AND ANOTHER LEFT LAB ON RIGHT DOWN HALLWAY :  BMET AND CBC TODAY      If you have labs (blood work) drawn today and your tests are completely normal, you will receive your results only by: MyChart Message (if you have MyChart) OR A paper copy in the mail If you have any lab test that is abnormal or we need to change your treatment, we will call you to review the results.   Testing/Procedures: NONE ORDERED  TODAY     Follow-Up: At [facility], you and your health needs are our priority.  As part of our continuing mission to provide you with exceptional heart care, we have created designated Provider Care Teams.  These Care Teams include your primary Cardiologist (physician) and Advanced Practice Providers (APPs -  Physician Assistants and Nurse Practitioners) who all work together to provide you with the care you need, when you need it.  We recommend signing up for the patient portal called "MyChart".  Sign up information is provided on this After Visit Summary.  MyChart is used to connect with patients for Virtual Visits (Telemedicine).  Patients are able to view lab/test results, encounter notes, upcoming appointments, etc.  Non-urgent messages can be sent to your provider as well.   To learn more about what you can do with MyChart, go to [email or web address].    Your next appointment:    1 year(s)   Provider:    Steffanie Dunn, MD or Francis Dowse, PA-C    Other Instructions

## 2023-11-04 LAB — CBC
Hematocrit: 43.9 % (ref 37.5–51.0)
Hemoglobin: 14.2 g/dL (ref 13.0–17.7)
MCH: 29.1 pg (ref 26.6–33.0)
MCHC: 32.3 g/dL (ref 31.5–35.7)
MCV: 90 fL (ref 79–97)
Platelets: 193 10*3/uL (ref 150–450)
RBC: 4.88 x10E6/uL (ref 4.14–5.80)
RDW: 13 % (ref 11.6–15.4)
WBC: 8.4 10*3/uL (ref 3.4–10.8)

## 2023-11-04 LAB — BASIC METABOLIC PANEL
BUN/Creatinine Ratio: 14 (ref 10–24)
BUN: 24 mg/dL (ref 8–27)
CO2: 22 mmol/L (ref 20–29)
Calcium: 9.6 mg/dL (ref 8.6–10.2)
Chloride: 107 mmol/L — ABNORMAL HIGH (ref 96–106)
Creatinine, Ser: 1.74 mg/dL — ABNORMAL HIGH (ref 0.76–1.27)
Glucose: 81 mg/dL (ref 70–99)
Potassium: 4.9 mmol/L (ref 3.5–5.2)
Sodium: 145 mmol/L — ABNORMAL HIGH (ref 134–144)
eGFR: 41 mL/min/{1.73_m2} — ABNORMAL LOW (ref >59)

## 2023-11-14 DIAGNOSIS — I5020 Unspecified systolic (congestive) heart failure: Secondary | ICD-10-CM | POA: Diagnosis not present

## 2023-11-14 DIAGNOSIS — I480 Paroxysmal atrial fibrillation: Secondary | ICD-10-CM | POA: Diagnosis not present

## 2023-11-14 DIAGNOSIS — N1832 Chronic kidney disease, stage 3b: Secondary | ICD-10-CM | POA: Diagnosis not present

## 2023-11-14 DIAGNOSIS — E785 Hyperlipidemia, unspecified: Secondary | ICD-10-CM | POA: Diagnosis not present

## 2023-11-16 ENCOUNTER — Other Ambulatory Visit: Payer: Self-pay | Admitting: Cardiovascular Disease

## 2024-01-04 ENCOUNTER — Other Ambulatory Visit: Payer: Self-pay | Admitting: Cardiovascular Disease

## 2024-01-23 DIAGNOSIS — Z0000 Encounter for general adult medical examination without abnormal findings: Secondary | ICD-10-CM | POA: Diagnosis not present

## 2024-01-23 DIAGNOSIS — E213 Hyperparathyroidism, unspecified: Secondary | ICD-10-CM | POA: Diagnosis not present

## 2024-01-23 DIAGNOSIS — D6869 Other thrombophilia: Secondary | ICD-10-CM | POA: Diagnosis not present

## 2024-01-23 DIAGNOSIS — E782 Mixed hyperlipidemia: Secondary | ICD-10-CM | POA: Diagnosis not present

## 2024-01-23 DIAGNOSIS — D638 Anemia in other chronic diseases classified elsewhere: Secondary | ICD-10-CM | POA: Diagnosis not present

## 2024-01-23 DIAGNOSIS — I5022 Chronic systolic (congestive) heart failure: Secondary | ICD-10-CM | POA: Diagnosis not present

## 2024-01-23 DIAGNOSIS — N1832 Chronic kidney disease, stage 3b: Secondary | ICD-10-CM | POA: Diagnosis not present

## 2024-01-23 DIAGNOSIS — M109 Gout, unspecified: Secondary | ICD-10-CM | POA: Diagnosis not present

## 2024-01-23 DIAGNOSIS — G4700 Insomnia, unspecified: Secondary | ICD-10-CM | POA: Diagnosis not present

## 2024-01-23 DIAGNOSIS — E039 Hypothyroidism, unspecified: Secondary | ICD-10-CM | POA: Diagnosis not present

## 2024-01-23 DIAGNOSIS — R21 Rash and other nonspecific skin eruption: Secondary | ICD-10-CM | POA: Diagnosis not present

## 2024-03-07 DIAGNOSIS — S51852A Open bite of left forearm, initial encounter: Secondary | ICD-10-CM | POA: Diagnosis not present

## 2024-04-09 ENCOUNTER — Telehealth: Payer: Self-pay | Admitting: Cardiovascular Disease

## 2024-04-09 NOTE — Telephone Encounter (Signed)
  Per answering service message:  Patient may have dental extraction today and has a question regarding resuming his Eloquis

## 2024-04-09 NOTE — Telephone Encounter (Signed)
 Pt reports that he is going to go to the dentist today for an evaluation of his broken tooth. They may end up doing a tooth extraction; but probably not today. He is asking about taking his Eliquis - does he need to hold it for a few doses.  Informed him that usually his dentist will ask for guidance on Eliquis  prior to the procedure-from the Cardiologist. Usually they will send a request, asked him to make sure with his dentist. After they know what type of procedure they are going to perform, we will be able to give further guidance.  He verbalized understanding.

## 2024-05-12 DIAGNOSIS — I483 Typical atrial flutter: Secondary | ICD-10-CM | POA: Diagnosis not present

## 2024-05-12 DIAGNOSIS — N179 Acute kidney failure, unspecified: Secondary | ICD-10-CM | POA: Diagnosis not present

## 2024-05-12 DIAGNOSIS — Z8739 Personal history of other diseases of the musculoskeletal system and connective tissue: Secondary | ICD-10-CM | POA: Diagnosis not present

## 2024-05-12 DIAGNOSIS — Z8679 Personal history of other diseases of the circulatory system: Secondary | ICD-10-CM | POA: Diagnosis not present

## 2024-05-12 DIAGNOSIS — I4891 Unspecified atrial fibrillation: Secondary | ICD-10-CM | POA: Diagnosis not present

## 2024-05-12 DIAGNOSIS — I480 Paroxysmal atrial fibrillation: Secondary | ICD-10-CM | POA: Diagnosis not present

## 2024-05-12 DIAGNOSIS — M109 Gout, unspecified: Secondary | ICD-10-CM | POA: Diagnosis not present

## 2024-05-12 DIAGNOSIS — R0602 Shortness of breath: Secondary | ICD-10-CM | POA: Diagnosis not present

## 2024-05-12 DIAGNOSIS — I4892 Unspecified atrial flutter: Secondary | ICD-10-CM | POA: Diagnosis not present

## 2024-05-12 DIAGNOSIS — Z7901 Long term (current) use of anticoagulants: Secondary | ICD-10-CM | POA: Diagnosis not present

## 2024-05-12 DIAGNOSIS — I5022 Chronic systolic (congestive) heart failure: Secondary | ICD-10-CM | POA: Diagnosis not present

## 2024-05-12 DIAGNOSIS — N1832 Chronic kidney disease, stage 3b: Secondary | ICD-10-CM | POA: Diagnosis not present

## 2024-05-12 DIAGNOSIS — E785 Hyperlipidemia, unspecified: Secondary | ICD-10-CM | POA: Diagnosis not present

## 2024-05-12 DIAGNOSIS — Z79899 Other long term (current) drug therapy: Secondary | ICD-10-CM | POA: Diagnosis not present

## 2024-05-13 DIAGNOSIS — N1832 Chronic kidney disease, stage 3b: Secondary | ICD-10-CM | POA: Diagnosis not present

## 2024-05-13 DIAGNOSIS — E785 Hyperlipidemia, unspecified: Secondary | ICD-10-CM | POA: Diagnosis not present

## 2024-05-13 DIAGNOSIS — I4891 Unspecified atrial fibrillation: Secondary | ICD-10-CM | POA: Diagnosis not present

## 2024-05-13 DIAGNOSIS — Z8739 Personal history of other diseases of the musculoskeletal system and connective tissue: Secondary | ICD-10-CM | POA: Diagnosis not present

## 2024-05-14 ENCOUNTER — Telehealth: Payer: Self-pay | Admitting: Cardiology

## 2024-05-14 NOTE — Telephone Encounter (Signed)
 Patient states that on Friday he went into Afib with RVR, HR of 120. He states that he felt very fatigued and went to [facility] in [place] and has remained there over the weekend. They gave him the options of having a cardioversion done tomorrow or being sent home on an antiarrythmic. Patient does not want to have a cardioversion done. He would prefer to be sent home and follow up with our office. He is scheduled for an appointment on 10/3.

## 2024-05-14 NOTE — Telephone Encounter (Signed)
 Patient stated [facility] is in [facility] for palpitations and wants to get Dr. Hiram view on him getting his heart shocked.  Patient wants a call back ASAP as he will need to make a decision by 12:00 noon today (9/29).

## 2024-05-15 DIAGNOSIS — N1832 Chronic kidney disease, stage 3b: Secondary | ICD-10-CM | POA: Diagnosis not present

## 2024-05-15 DIAGNOSIS — Z8739 Personal history of other diseases of the musculoskeletal system and connective tissue: Secondary | ICD-10-CM | POA: Diagnosis not present

## 2024-05-15 DIAGNOSIS — E785 Hyperlipidemia, unspecified: Secondary | ICD-10-CM | POA: Diagnosis not present

## 2024-05-16 DIAGNOSIS — M109 Gout, unspecified: Secondary | ICD-10-CM | POA: Diagnosis not present

## 2024-05-16 DIAGNOSIS — I5022 Chronic systolic (congestive) heart failure: Secondary | ICD-10-CM | POA: Diagnosis not present

## 2024-05-16 DIAGNOSIS — E785 Hyperlipidemia, unspecified: Secondary | ICD-10-CM | POA: Diagnosis not present

## 2024-05-16 DIAGNOSIS — I4891 Unspecified atrial fibrillation: Secondary | ICD-10-CM | POA: Diagnosis not present

## 2024-05-16 DIAGNOSIS — N1832 Chronic kidney disease, stage 3b: Secondary | ICD-10-CM | POA: Diagnosis not present

## 2024-05-16 DIAGNOSIS — Z7901 Long term (current) use of anticoagulants: Secondary | ICD-10-CM | POA: Diagnosis not present

## 2024-05-16 DIAGNOSIS — I483 Typical atrial flutter: Secondary | ICD-10-CM | POA: Diagnosis not present

## 2024-05-16 DIAGNOSIS — I480 Paroxysmal atrial fibrillation: Secondary | ICD-10-CM | POA: Diagnosis not present

## 2024-05-16 DIAGNOSIS — Z79899 Other long term (current) drug therapy: Secondary | ICD-10-CM | POA: Diagnosis not present

## 2024-05-16 DIAGNOSIS — Z8739 Personal history of other diseases of the musculoskeletal system and connective tissue: Secondary | ICD-10-CM | POA: Diagnosis not present

## 2024-05-16 DIAGNOSIS — N179 Acute kidney failure, unspecified: Secondary | ICD-10-CM | POA: Diagnosis not present

## 2024-05-18 ENCOUNTER — Encounter: Payer: Self-pay | Admitting: Student

## 2024-05-18 ENCOUNTER — Ambulatory Visit: Attending: Student | Admitting: Cardiology

## 2024-05-18 VITALS — BP 124/70 | HR 61 | Ht 71.0 in | Wt 232.7 lb

## 2024-05-18 DIAGNOSIS — I5020 Unspecified systolic (congestive) heart failure: Secondary | ICD-10-CM | POA: Diagnosis not present

## 2024-05-18 DIAGNOSIS — D6869 Other thrombophilia: Secondary | ICD-10-CM | POA: Diagnosis not present

## 2024-05-18 DIAGNOSIS — I4819 Other persistent atrial fibrillation: Secondary | ICD-10-CM

## 2024-05-18 DIAGNOSIS — I428 Other cardiomyopathies: Secondary | ICD-10-CM | POA: Diagnosis not present

## 2024-05-18 NOTE — Patient Instructions (Signed)
 Medication Instructions:  Your physician recommends that you continue on your current medications as directed. Please refer to the Current Medication list given to you today.  *If you need a refill on your cardiac medications before your next appointment, please call your pharmacy*  Lab Work: None ordered If you have labs (blood work) drawn today and your tests are completely normal, you will receive your results only by: MyChart Message (if you have MyChart) OR A paper copy in the mail If you have any lab test that is abnormal or we need to change your treatment, we will call you to review the results.  Follow-Up: At [facility], you and your health needs are our priority.  As part of our continuing mission to provide you with exceptional heart care, our providers are all part of one team.  This team includes your primary Cardiologist (physician) and Advanced Practice Providers or APPs (Physician Assistants and Nurse Practitioners) who all work together to provide you with the care you need, when you need it.  Your next appointment:   6 month(s)  Provider:   You may see OLE ONEIDA HOLTS, MD or one of the following Advanced Practice Providers on your designated Care Team:   Charlies Arthur, [place] Ozell Jodie Passey, PA-C Suzann Riddle, NP Daphne Barrack, NP Artist Pouch, PA-C

## 2024-05-18 NOTE — Progress Notes (Signed)
 Electrophysiology Office Note:   Date:  05/18/2024  ID:  Brian Weeks, DOB [birth year], MRN [id number]  Primary Cardiologist: Ozell Fell, MD Electrophysiologist: OLE ONEIDA HOLTS, MD   Electrophysiologist:  OLE ONEIDA HOLTS, MD      History of Present Illness:   Brian Weeks is a 73 y.o. male with h/o HLD, HFrEF, AFib, CKD (IIIb)  seen today for electrophysiology followup after recent admission.   Patient's medical history notable for afib diagnosed in 2022 along with HFrEF. Patient with admission in 2022 with AKI and hypotension. Following this admission, patient's BB held and a subsequent Zio showed ~21% afib burden. Patient then referred to Dr. HOLTS for an ablation. Pre-ablation echo showed LVEF recovery to low normal. Afib ablation completed on 12/14/21. Patient doing well at last OV with [facility] Weeks.   Notably patient was admitted to [facility] on 05/12/24 with malaise/fatigue with palpitations similar to how he felt with prior afib. At [facility], patient found to be in afib with RVR and was admitted for further management. He received oral lopressor  and Cardizem  along with Eliquis . EF normal on echo. Patient was seen by [facility] cardiology and had successful DCCV on 05/16/24. Patient given Toprol  XL 25mg  to be taken daily at d/c.   Since his admission and DCCV, the patient reports doing well. Denies any symptoms of recurrent afib, has not had fatigue, palpitations.  he also denies chest pain, dyspnea, PND, orthopnea, nausea, vomiting, dizziness, syncope, edema, weight gain, or early satiety.   Review of systems complete and found to be negative unless listed in HPI.   EP Information / Studies Reviewed:    EKG is ordered today. Personal review as below.  EKG Interpretation Date/Time:  Friday May 18 2024 08:46:57 EDT Ventricular Rate:  61 PR Interval:  180 QRS Duration:  94 QT Interval:  424 QTC Calculation: 426 R Axis:   -8  Text Interpretation: Normal  sinus rhythm Incomplete right bundle branch block When compared with ECG of 03-Nov-2023 14:21, No significant change was found Confirmed by Trudy Birmingham [phone]) on 05/18/2024 8:59:32 AM    Arrhythmia/Device History No specialty comments available.   Physical Exam:   VS:  BP 124/70   Pulse 61   Ht 5' 11 (1.803 m)   Wt 232 lb 11.2 oz (105.6 kg)   SpO2 (!) 70%   BMI 32.46 kg/m    Wt Readings from Last 3 Encounters:  05/18/24 232 lb 11.2 oz (105.6 kg)  11/03/23 232 lb 6.4 oz (105.4 kg)  10/12/22 226 lb (102.5 kg)     GEN: No acute distress NECK: No JVD; No carotid bruits CARDIAC: Regular rate and rhythm, no murmurs, rubs, gallops RESPIRATORY:  Clear to auscultation without rales, wheezing or rhonchi  ABDOMEN: Soft, non-tender, non-distended EXTREMITIES:  No edema; No deformity   ASSESSMENT AND PLAN:    Paroxysmal atrial fibrillation Secondary hypercoagulable state S/p afib/flutter ablation in 2023 with Dr. HOLTS. First known recurrence of afib last week. Patient without symptoms of afib since this recent DCCV. Discussed watchful waiting vs antiarrhythmic vs redo ablation with pulse field. Given only one recurrent episode of afib, patient wishes to continue with watchful waiting. Emphasized importance of continuing with BID Eliquis . Will have patient take Toprol  XL 25mg  PRN for breakthrough tachycardia. Encouraged him to call the office if he develops recurrent afib so that AAD vs ablation can be discussed again.   HFrecEF Patient with prior HFrEF, has had low normal to normal EF since 2023. EF at  [facility] on 05/13/24 55-60%.      Follow up with EP Team in 6 months  Signed, Artist Pouch, PA-C

## 2024-05-21 DIAGNOSIS — N1832 Chronic kidney disease, stage 3b: Secondary | ICD-10-CM | POA: Diagnosis not present

## 2024-07-02 ENCOUNTER — Other Ambulatory Visit: Payer: Self-pay | Admitting: Cardiology

## 2024-07-02 DIAGNOSIS — I4891 Unspecified atrial fibrillation: Secondary | ICD-10-CM

## 2024-07-02 NOTE — Telephone Encounter (Signed)
 Prescription refill request for Eliquis  received. Indication:afib Last office visit:10/25 Scr:1.47  9/25 Age: 73 Weight:105.6  kg  Prescription refilled

## 2024-07-25 ENCOUNTER — Telehealth: Payer: Self-pay | Admitting: Cardiovascular Disease

## 2024-07-25 NOTE — Telephone Encounter (Signed)
° °  Pre-operative Risk Assessment    Patient Name: Brian Weeks  DOB: [birth year] MRN: [id number]   Date of last office visit: 05/18/2024 Date of next office visit: TBD  Request for Surgical Clearance    Procedure:  Dental Extraction - Amount of Teeth to be Pulled:  One  Date of Surgery:  Clearance 08/21/24                                Surgeon:  Dr. Selinda Elder Surgeon's Group or Practice Name:  [facility] DDS, PA Phone number:  [phone] Fax number:  [phone]   Type of Clearance Requested:   - Medical  - Pharmacy:  Hold Apixaban  (Eliquis ) 2 days prior   Type of Anesthesia:  None    Additional requests/questions:    [name]   07/25/2024, 2:20 PM

## 2024-07-25 NOTE — Telephone Encounter (Signed)
° ° °  Primary Cardiologist: Ozell Fell, MD  Chart reviewed as part of pre-operative protocol coverage. Simple dental extractions are considered low risk procedures per guidelines and generally do not require any specific cardiac clearance. It is also generally accepted that for simple extractions and dental cleanings, there is no need to interrupt blood thinner therapy.   SBE prophylaxis is not required for the patient.  I will route this recommendation to the requesting party via Epic fax function and remove from pre-op pool.  Please call with questions.  Rollo FABIENE Louder, PA-C 07/25/2024, 2:30 PM

## 2024-11-23 ENCOUNTER — Ambulatory Visit: Admitting: Student
